# Patient Record
Sex: Male | Born: 1950
Health system: Southern US, Community
[De-identification: ages and names within clinical notes are randomized; demographics above are authoritative.]

## PROBLEM LIST (undated history)

## (undated) DIAGNOSIS — Z86010 Personal history of colonic polyps: Secondary | ICD-10-CM

## (undated) DIAGNOSIS — E782 Mixed hyperlipidemia: Secondary | ICD-10-CM

## (undated) DIAGNOSIS — F411 Generalized anxiety disorder: Secondary | ICD-10-CM

## (undated) DIAGNOSIS — I213 ST elevation (STEMI) myocardial infarction of unspecified site: Secondary | ICD-10-CM

## (undated) DIAGNOSIS — R7309 Other abnormal glucose: Secondary | ICD-10-CM

## (undated) DIAGNOSIS — E119 Type 2 diabetes mellitus without complications: Secondary | ICD-10-CM

## (undated) DIAGNOSIS — F17200 Nicotine dependence, unspecified, uncomplicated: Secondary | ICD-10-CM

## (undated) HISTORY — DX: Generalized anxiety disorder: F41.1

## (undated) HISTORY — DX: Type 2 diabetes mellitus without complications: E11.9

## (undated) HISTORY — PX: WISDOM TOOTH EXTRACTION: SHX21

## (undated) HISTORY — DX: Personal history of colonic polyps: Z86.010

## (undated) HISTORY — DX: Nicotine dependence, unspecified, uncomplicated: F17.200

## (undated) HISTORY — DX: Other abnormal glucose: R73.09

## (undated) HISTORY — DX: Mixed hyperlipidemia: E78.2

## (undated) HISTORY — PX: OTHER SURGICAL HISTORY: SHX169

## (undated) HISTORY — PX: COLONOSCOPY: SHX174

---

## 1998-12-02 ENCOUNTER — Encounter: Payer: Self-pay | Admitting: Internal Medicine

## 1998-12-02 ENCOUNTER — Ambulatory Visit (HOSPITAL_COMMUNITY): Admission: RE | Admit: 1998-12-02 | Discharge: 1998-12-02 | Payer: Self-pay | Admitting: Internal Medicine

## 1999-06-14 ENCOUNTER — Emergency Department (HOSPITAL_COMMUNITY): Admission: EM | Admit: 1999-06-14 | Discharge: 1999-06-14 | Payer: Self-pay

## 2004-03-12 ENCOUNTER — Encounter: Payer: Self-pay | Admitting: Gastroenterology

## 2004-03-12 LAB — HM COLONOSCOPY: HM Colonoscopy: ABNORMAL

## 2004-12-17 ENCOUNTER — Ambulatory Visit: Payer: Self-pay | Admitting: Internal Medicine

## 2006-04-03 ENCOUNTER — Ambulatory Visit: Payer: Self-pay | Admitting: Family Medicine

## 2006-04-13 ENCOUNTER — Ambulatory Visit: Payer: Self-pay | Admitting: Internal Medicine

## 2006-04-16 ENCOUNTER — Ambulatory Visit: Payer: Self-pay

## 2006-07-19 ENCOUNTER — Ambulatory Visit: Payer: Self-pay | Admitting: Internal Medicine

## 2006-07-20 ENCOUNTER — Ambulatory Visit: Payer: Self-pay | Admitting: Licensed Clinical Social Worker

## 2006-07-28 ENCOUNTER — Ambulatory Visit: Payer: Self-pay | Admitting: Licensed Clinical Social Worker

## 2006-08-04 ENCOUNTER — Ambulatory Visit: Payer: Self-pay | Admitting: Licensed Clinical Social Worker

## 2006-08-06 ENCOUNTER — Ambulatory Visit: Payer: Self-pay | Admitting: Licensed Clinical Social Worker

## 2006-08-19 ENCOUNTER — Ambulatory Visit: Payer: Self-pay | Admitting: Licensed Clinical Social Worker

## 2007-04-26 ENCOUNTER — Ambulatory Visit: Payer: Self-pay | Admitting: Internal Medicine

## 2007-04-26 LAB — CONVERTED CEMR LAB
ALT: 24 units/L (ref 0–53)
AST: 16 units/L (ref 0–37)
Albumin: 3.8 g/dL (ref 3.5–5.2)
Alkaline Phosphatase: 67 units/L (ref 39–117)
BUN: 12 mg/dL (ref 6–23)
Basophils Absolute: 0.1 10*3/uL (ref 0.0–0.1)
Basophils Relative: 0.9 % (ref 0.0–1.0)
Bilirubin Urine: NEGATIVE
Bilirubin, Direct: 0.1 mg/dL (ref 0.0–0.3)
CO2: 30 meq/L (ref 19–32)
Calcium: 9.5 mg/dL (ref 8.4–10.5)
Chloride: 107 meq/L (ref 96–112)
Cholesterol: 209 mg/dL (ref 0–200)
Creatinine, Ser: 1.2 mg/dL (ref 0.4–1.5)
Direct LDL: 151 mg/dL
Eosinophils Absolute: 0.2 10*3/uL (ref 0.0–0.6)
Eosinophils Relative: 1.7 % (ref 0.0–5.0)
GFR calc Af Amer: 81 mL/min
GFR calc non Af Amer: 67 mL/min
Glucose, Bld: 149 mg/dL — ABNORMAL HIGH (ref 70–99)
HCT: 46.4 % (ref 39.0–52.0)
HDL: 29.1 mg/dL — ABNORMAL LOW (ref >39.0)
Hemoglobin: 15.7 g/dL (ref 13.0–17.0)
Ketones, ur: NEGATIVE mg/dL
Leukocytes, UA: NEGATIVE
Lymphocytes Relative: 25.4 % (ref 12.0–46.0)
MCHC: 33.9 g/dL (ref 30.0–36.0)
MCV: 89 fL (ref 78.0–100.0)
Monocytes Absolute: 0.7 10*3/uL (ref 0.2–0.7)
Monocytes Relative: 7.4 % (ref 3.0–11.0)
Neutro Abs: 6 10*3/uL (ref 1.4–7.7)
Neutrophils Relative %: 64.6 % (ref 43.0–77.0)
Nitrite: NEGATIVE
PSA: 0.47 ng/mL (ref 0.10–4.00)
Platelets: 216 10*3/uL (ref 150–400)
Potassium: 4.1 meq/L (ref 3.5–5.1)
RBC: 5.22 M/uL (ref 4.22–5.81)
RDW: 12.4 % (ref 11.5–14.6)
Sodium: 143 meq/L (ref 135–145)
Specific Gravity, Urine: 1.02 (ref 1.000–1.03)
TSH: 1.1 microintl units/mL (ref 0.35–5.50)
Total Bilirubin: 0.6 mg/dL (ref 0.3–1.2)
Total CHOL/HDL Ratio: 7.2
Total Protein, Urine: NEGATIVE mg/dL
Total Protein: 6.5 g/dL (ref 6.0–8.3)
Triglycerides: 156 mg/dL — ABNORMAL HIGH (ref 0–149)
Urine Glucose: NEGATIVE mg/dL
Urobilinogen, UA: 0.2 (ref 0.0–1.0)
VLDL: 31 mg/dL (ref 0–40)
WBC: 9.4 10*3/uL (ref 4.5–10.5)
pH: 6 (ref 5.0–8.0)

## 2007-05-03 ENCOUNTER — Encounter: Payer: Self-pay | Admitting: Internal Medicine

## 2007-05-03 ENCOUNTER — Ambulatory Visit: Payer: Self-pay | Admitting: Internal Medicine

## 2007-05-03 DIAGNOSIS — F17200 Nicotine dependence, unspecified, uncomplicated: Secondary | ICD-10-CM

## 2007-05-03 DIAGNOSIS — E782 Mixed hyperlipidemia: Secondary | ICD-10-CM

## 2007-05-03 DIAGNOSIS — F411 Generalized anxiety disorder: Secondary | ICD-10-CM | POA: Insufficient documentation

## 2007-05-03 DIAGNOSIS — R7309 Other abnormal glucose: Secondary | ICD-10-CM

## 2007-05-03 HISTORY — DX: Other abnormal glucose: R73.09

## 2007-05-03 HISTORY — DX: Nicotine dependence, unspecified, uncomplicated: F17.200

## 2007-05-03 HISTORY — DX: Mixed hyperlipidemia: E78.2

## 2007-05-03 HISTORY — DX: Generalized anxiety disorder: F41.1

## 2007-12-04 ENCOUNTER — Encounter: Payer: Self-pay | Admitting: Internal Medicine

## 2007-12-08 ENCOUNTER — Ambulatory Visit: Payer: Self-pay | Admitting: Internal Medicine

## 2007-12-09 ENCOUNTER — Ambulatory Visit: Payer: Self-pay | Admitting: Internal Medicine

## 2008-04-19 ENCOUNTER — Encounter: Payer: Self-pay | Admitting: Internal Medicine

## 2009-06-25 ENCOUNTER — Encounter (INDEPENDENT_AMBULATORY_CARE_PROVIDER_SITE_OTHER): Payer: Self-pay | Admitting: *Deleted

## 2009-07-03 ENCOUNTER — Ambulatory Visit: Payer: Self-pay | Admitting: Internal Medicine

## 2009-07-03 LAB — CONVERTED CEMR LAB
ALT: 29 units/L (ref 0–53)
AST: 17 units/L (ref 0–37)
Albumin: 4 g/dL (ref 3.5–5.2)
Alkaline Phosphatase: 67 units/L (ref 39–117)
BUN: 12 mg/dL (ref 6–23)
Basophils Absolute: 0.1 10*3/uL (ref 0.0–0.1)
Basophils Relative: 0.7 % (ref 0.0–3.0)
Bilirubin Urine: NEGATIVE
Bilirubin, Direct: 0 mg/dL (ref 0.0–0.3)
CO2: 30 meq/L (ref 19–32)
Calcium: 9.4 mg/dL (ref 8.4–10.5)
Chloride: 104 meq/L (ref 96–112)
Cholesterol: 202 mg/dL — ABNORMAL HIGH (ref 0–200)
Creatinine, Ser: 1.2 mg/dL (ref 0.4–1.5)
Direct LDL: 150.5 mg/dL
Eosinophils Absolute: 0.1 10*3/uL (ref 0.0–0.7)
Eosinophils Relative: 1.8 % (ref 0.0–5.0)
GFR calc non Af Amer: 66.41 mL/min (ref >60)
Glucose, Bld: 142 mg/dL — ABNORMAL HIGH (ref 70–99)
HCT: 50.1 % (ref 39.0–52.0)
HDL: 30.3 mg/dL — ABNORMAL LOW (ref >39.00)
Hemoglobin, Urine: NEGATIVE
Hemoglobin: 16.4 g/dL (ref 13.0–17.0)
Ketones, ur: NEGATIVE mg/dL
Leukocytes, UA: NEGATIVE
Lymphocytes Relative: 23.7 % (ref 12.0–46.0)
Lymphs Abs: 1.9 10*3/uL (ref 0.7–4.0)
MCHC: 32.7 g/dL (ref 30.0–36.0)
MCV: 93.6 fL (ref 78.0–100.0)
Monocytes Absolute: 0.5 10*3/uL (ref 0.1–1.0)
Monocytes Relative: 6.8 % (ref 3.0–12.0)
Neutro Abs: 5.3 10*3/uL (ref 1.4–7.7)
Neutrophils Relative %: 67 % (ref 43.0–77.0)
Nitrite: NEGATIVE
PSA: 0.64 ng/mL (ref 0.10–4.00)
Platelets: 175 10*3/uL (ref 150.0–400.0)
Potassium: 4.2 meq/L (ref 3.5–5.1)
RBC: 5.36 M/uL (ref 4.22–5.81)
RDW: 12.2 % (ref 11.5–14.6)
Sodium: 140 meq/L (ref 135–145)
Specific Gravity, Urine: 1.02 (ref 1.000–1.030)
TSH: 0.67 microintl units/mL (ref 0.35–5.50)
Total Bilirubin: 0.7 mg/dL (ref 0.3–1.2)
Total CHOL/HDL Ratio: 7
Total Protein, Urine: NEGATIVE mg/dL
Total Protein: 6.7 g/dL (ref 6.0–8.3)
Triglycerides: 141 mg/dL (ref 0.0–149.0)
Urine Glucose: NEGATIVE mg/dL
Urobilinogen, UA: 0.2 (ref 0.0–1.0)
VLDL: 28.2 mg/dL (ref 0.0–40.0)
WBC: 7.9 10*3/uL (ref 4.5–10.5)
pH: 6 (ref 5.0–8.0)

## 2009-07-09 ENCOUNTER — Ambulatory Visit: Payer: Self-pay | Admitting: Internal Medicine

## 2009-07-09 DIAGNOSIS — Z860100 Personal history of colon polyps, unspecified: Secondary | ICD-10-CM | POA: Insufficient documentation

## 2009-07-09 HISTORY — DX: Personal history of colonic polyps: Z86.010

## 2009-07-09 HISTORY — DX: Personal history of colon polyps, unspecified: Z86.0100

## 2009-07-09 LAB — CONVERTED CEMR LAB: Hgb A1c MFr Bld: 7.6 % — ABNORMAL HIGH (ref 4.6–6.5)

## 2009-08-28 ENCOUNTER — Encounter (INDEPENDENT_AMBULATORY_CARE_PROVIDER_SITE_OTHER): Payer: Self-pay

## 2009-08-30 ENCOUNTER — Ambulatory Visit: Payer: Self-pay | Admitting: Gastroenterology

## 2010-02-13 ENCOUNTER — Telehealth: Payer: Self-pay | Admitting: Internal Medicine

## 2010-02-15 ENCOUNTER — Encounter: Payer: Self-pay | Admitting: Internal Medicine

## 2010-02-20 ENCOUNTER — Telehealth: Payer: Self-pay | Admitting: Gastroenterology

## 2010-05-26 ENCOUNTER — Ambulatory Visit: Payer: Self-pay | Admitting: Internal Medicine

## 2010-09-10 ENCOUNTER — Encounter: Payer: Self-pay | Admitting: Internal Medicine

## 2010-09-11 NOTE — Letter (Signed)
[facility]   Imported By: Sherian Rein 02/21/2010 12:14:36  _____________________________________________________________________  External Attachment:    Type:   Image     Comment:   External Document

## 2010-09-11 NOTE — Progress Notes (Signed)
Summary: Schedule Colonoscopy   Phone Note Outgoing Call Call back at Home Phone [phone]   Call placed by: Harlow Mares CMA Duncan Dull),  February 20, 2010 4:33 PM Call placed to: Patient Summary of Call: Left message on patients machine to call back, patient is due for a colonoscopy, he did have it schedule this past Jan but cancelled and did not resch.  Initial call taken by: Harlow Mares CMA Duncan Dull),  February 20, 2010 4:33 PM  Follow-up for Phone Call        Left a message on the patient machine to call back and schedule a previsit and procedure with our office. A letter will be mailed to the patient.   Follow-up by: Harlow Mares CMA Duncan Dull),  February 28, 2010 4:02 PM

## 2010-09-11 NOTE — Progress Notes (Signed)
Summary: NEW MEDS   Phone Note Call from Patient Call back at [phone]   Summary of Call: Spoke w/patient, he was looking over the last office visit notes. He never recieved an rx for simvastatin or metformin and never called the office to inquire further. Pt aware MD is out of the office and we would get back to him next week. After all this time, does pt need additional labs? f/u office visit? Please advise.  Initial call taken by: Lamar Sprinkles, CMA,  February 13, 2010 4:16 PM  Follow-up for Phone Call        It appears he did receive a copy of the office note which laid out the plan in a clear fashion. He does need simvastatin 40mg  qPM - he was on 20mg ; he does need metformin 500mg  two times a day - take qAM only for 1 week, then two times a day. He will need lab 4 weeks after increase in simvastatin. He will need A1C in 3 months.  If he is unclear about any of this an OV will be necessary Follow-up by: Jacques Navy MD,  February 15, 2010 4:16 PM  Additional Follow-up for Phone Call Additional follow up Details #1::        Pt states he was not on 20mg  of simvastatin. He takes no medication at this time. So office visit? or start at 20mg ?  Additional Follow-up by: Lamar Sprinkles, CMA,  February 17, 2010 1:41 PM    Additional Follow-up for Phone Call Additional follow up Details #2::    start at 40mg -diabetic, needs to have LDL less than 100. Follow-up by: Jacques Navy MD,  February 17, 2010 6:04 PM  Additional Follow-up for Phone Call Additional follow up Details #3:: Details for Additional Follow-up Action Taken: Pt informed Additional Follow-up by: Lamar Sprinkles, CMA,  February 18, 2010 12:40 PM  Prescriptions: METFORMIN HCL 500 MG TABS (METFORMIN HCL) 1 by mouth two times a day  #180 x 1   Entered by:   Lamar Sprinkles, CMA   Authorized by:   Jacques Navy MD   Signed by:   Lamar Sprinkles, CMA on 02/18/2010   Method used:   [facility] to        [phone][facility] (retail)  [address]       [place], [place]  [address]       Ph: [phone] or [phone]       Fax: [phone]   RxID:   [id number] SIMVASTATIN 40 MG TABS (SIMVASTATIN) 1 by mouth qPM  #90 x 1   Entered by:   Lamar Sprinkles, CMA   Authorized by:   Jacques Navy MD   Signed by:   Lamar Sprinkles, CMA on 02/18/2010   Method used:   Electronically to        [facility] [phone]* (retail)       [address]       [place], [place]  [address]       Ph: [phone] or [phone]       Fax: [phone]   RxID:   [id number]

## 2010-09-11 NOTE — Letter (Signed)
Summary: [facility] Instructions  [facility]  [address] [place], [place] [address]   Phone: [phone]  Fax: [phone]       Adam Farmer    [birth year]    MRN: [id number]        Procedure Day Dorna Bloom:  Farrell Ours  09/13/09     Arrival Time:  8:30AM      Procedure Time:  9:30AM     Location of Procedure:                    [facility]iann Pares _  [facility] (4th Floor)                        PREPARATION FOR COLONOSCOPY WITH MOVIPREP   Starting 58 days prior to your procedure 09/08/09 do not eat nuts, seeds, popcorn, corn, beans, peas,  salads, or any raw vegetables.  Do not take any fiber supplements (e.g. Metamucil, Citrucel, and Benefiber).  THE DAY BEFORE YOUR PROCEDURE         DATE:  02/03  DAY: [name].  1.  Drink clear liquids the entire day-NO SOLID FOOD  2.  Do not drink anything colored red or purple.  Avoid juices with pulp.  No orange juice.  3.  Drink at least 64 oz. (8 glasses) of fluid/clear liquids during the day to prevent dehydration and help the prep work efficiently.  CLEAR LIQUIDS INCLUDE: Water Jello Ice Popsicles Tea (sugar ok, no milk/cream) Powdered fruit flavored drinks Coffee (sugar ok, no milk/cream) Gatorade Juice: apple, white grape, white cranberry  Lemonade Clear bullion, consomm, broth Carbonated beverages (any kind) Strained chicken noodle soup Hard Candy                             4.  In the morning, mix first dose of MoviPrep solution:    Empty 1 Pouch A and 1 Pouch B into the disposable container    Add lukewarm drinking water to the top line of the container. Mix to dissolve    Refrigerate (mixed solution should be used within 24 hrs)  5.  Begin drinking the prep at 5:00 p.m. The MoviPrep container is divided by 4 marks.   Every 15 minutes drink the solution down to the next mark (approximately 8 oz) until the full liter is complete.   6.  Follow completed prep with 16 oz of clear liquid of your choice (Nothing  red or purple).  Continue to drink clear liquids until bedtime.  7.  Before going to bed, mix second dose of MoviPrep solution:    Empty 1 Pouch A and 1 Pouch B into the disposable container    Add lukewarm drinking water to the top line of the container. Mix to dissolve    Refrigerate  THE DAY OF YOUR PROCEDURE      DATE:  02/04  DAY: [name].  Beginning at  4:30 a.m. (5 hours before procedure):         1. Every 15 minutes, drink the solution down to the next mark (approx 8 oz) until the full liter is complete.  2. Follow completed prep with 16 oz. of clear liquid of your choice.    3. You may drink clear liquids until  7:30am  (2 HOURS BEFORE PROCEDURE).   MEDICATION INSTRUCTIONS  Unless otherwise instructed, you should take regular prescription medications with a small sip of water  as early as possible the morning of your procedure.          OTHER INSTRUCTIONS  You will need a responsible adult at least 58 years of age to accompany you and drive you home.   This person must remain in the waiting room during your procedure.  Wear loose fitting clothing that is easily removed.  Leave jewelry and other valuables at home.  However, you may wish to bring a book to read or  an iPod/MP3 player to listen to music as you wait for your procedure to start.  Remove all body piercing jewelry and leave at home.  Total time from sign-in until discharge is approximately 2-3 hours.  You should go home directly after your procedure and rest.  You can resume normal activities the  day after your procedure.  The day of your procedure you should not:   Drive   Make legal decisions   Operate machinery   Drink alcohol   Return to work  You will receive specific instructions about eating, activities and medications before you leave.    The above instructions have been reviewed and explained to me by   Ulis Rias RN  August 30, 2009 4:43 PM     I fully understand and  can verbalize these instructions _____________________________ Date _________

## 2010-09-11 NOTE — Miscellaneous (Signed)
Summary: [facility] previsit  Clinical Lists Changes  Medications: Added new medication of MOVIPREP 100 GM  SOLR (PEG-KCL-NACL-NASULF-NA ASC-C) As per prep instructions. - Signed Rx of MOVIPREP 100 GM  SOLR (PEG-KCL-NACL-NASULF-NA ASC-C) As per prep instructions.;  #1 x 0;  Signed;  Entered by: Ulis Rias RN;  Authorized by: Louis Meckel MD;  Method used: [facility] [phone]*, [address], [place], [place]  [address], Ph: [phone] or [phone], Fax: [phone] Allergies: Added new allergy or adverse reaction of * LATEX Observations: Added new observation of NKA: F (08/30/2009 16:15)    Prescriptions: MOVIPREP 100 GM  SOLR (PEG-KCL-NACL-NASULF-NA ASC-C) As per prep instructions.  #1 x 0   Entered by:   Ulis Rias RN   Authorized by:   Louis Meckel MD   Signed by:   Ulis Rias RN on 08/30/2009   Method used:   Electronically to        [facility] [phone]* (retail)       [address]       [place], [place]  [address]       Ph: [phone] or [phone]       Fax: [phone]   RxID:   [id number]

## 2010-09-12 ENCOUNTER — Encounter: Payer: Self-pay | Admitting: Internal Medicine

## 2010-09-25 NOTE — Letter (Signed)
Summary: [facility]   Imported By: Sherian Rein 09/19/2010 11:39:40  _____________________________________________________________________  External Attachment:    Type:   Image     Comment:   External Document

## 2010-12-26 NOTE — Assessment & Plan Note (Signed)
[facility]                             PRIMARY CARE OFFICE NOTE   NAME:Adam Farmer, Adam Farmer                      MRN:          [id number]  DATE:04/13/2006                            DOB:          1951-06-29    HISTORY OF PRESENT ILLNESS:  The patient is a 60 year old Caucasian  gentleman who was seen on August the 25th by Dr. Milinda Antis in Saturday clinic  and diagnosed at that time with acute bronchitis.  He was treated with a Z-  Pak and Mucinex-DM.  The patient reports that he has seen some improvement.  He is still coughing and bringing up scant amount of mucus.  There has been  no fever and no shortness of breath.   The patient is interested in smoking cessation.  Dr. Milinda Antis provided a  prescription for Chantix which the patient has started already.  His quit  date is for this coming Saturday, the 8th.   The patient is reporting that he is having problems with a squeezing type  left-sided chest pain as well as a squeezing discomfort in his neck.  This  seems to be stress related but not exertionally related.   CARDIAC RISKS:  This is a male who has a history of tobacco use.  He has an  elevated serum cholesterol at 230 with an LDL of 159.4 and HDL of 33.8 in  July 2005 with no subsequent lipid panel.   CURRENT MEDICATIONS:  Paxil CR 12.5 mg daily.   LIMITED EXAM:  VITAL SIGNS:  Temperature was 97.3.  Blood pressure 104/67.  Pulse 81.  Weight 187.  GENERAL APPEARANCE:  A well-nourished, well-developed, Caucasian male in no  acute distress.  CHEST:  The patient is moving air well.  There are no rales, wheezes or  rhonchi.  No CVA tenderness.  CARDIOVASCULAR:  2+ radial pulse.  He had no JVD.  No carotid bruits.  He  had a quiet precordium with a regular rate and rhythm.  I appreciated no  murmurs.  NECK:  Palpated and there was a normal sized thyroid.  There was no  lymphadenopathy.  No point tenderness to examination.   DATA BASE:  The patient  had a 12 lead electrocardiogram which revealed a  normal sinus rhythm with no strain pattern and no evidence of old injury.  This could be a normal EKG.   ASSESSMENT AND PLAN:  1. Bronchitis.  The patient states he is doing well with the resolution of      most of his symptoms.  He has some residual cough for which he can      continue to take the Mucinex and over-the-counter cough preparation.  2. Cardiovascular.  The patient with atypical type chest discomfort but      worrisome because of his risk factors.  Plan -  The patient will be set up for a nuclear heart study to rule out obstructive  coronary disease.  1. Hyperlipidemia.  The patient did have a lipid panel in 2005 which      showed an LDL of 159.4 with the ideal  level being 130 or less.  I would      recommend the patient have a followup with the panel and then make      recommendations based on the laboratory data.  Given his concern and my      concern for heart disease, we actually would definitely want his LDL      less than 130 and closer to 100.  2. Smoking cessation.  The patient has started Chantix.  I have encouraged      him in his quit date.  I have asked him to call 1-800-QUIT-NOW for      additional smoking cessation support.   The patient was carefully instructed that if he has a change in his chest  pain or chest discomfort, if he has diaphoresis or shortness of breath, if  he has pain with exertion, if he has pain that wakes him from sleep, he is  to call for an urgent evaluation.  If it is after hours or the weekend, he  should report to the [facility] Emergency Department and state that he is a  [facility] patient.                                   Rosalyn Gess Norins, MD   MEN/MedQ  DD:  04/13/2006  DT:  04/14/2006  Job #:  [id number]

## 2011-08-14 ENCOUNTER — Encounter: Payer: Self-pay | Admitting: Internal Medicine

## 2011-09-03 ENCOUNTER — Encounter: Payer: Self-pay | Admitting: Internal Medicine

## 2011-09-03 ENCOUNTER — Ambulatory Visit (INDEPENDENT_AMBULATORY_CARE_PROVIDER_SITE_OTHER): Payer: BC Managed Care – PPO | Admitting: Internal Medicine

## 2011-09-03 DIAGNOSIS — Z23 Encounter for immunization: Secondary | ICD-10-CM

## 2011-09-03 DIAGNOSIS — Z125 Encounter for screening for malignant neoplasm of prostate: Secondary | ICD-10-CM

## 2011-09-03 DIAGNOSIS — R7309 Other abnormal glucose: Secondary | ICD-10-CM

## 2011-09-03 DIAGNOSIS — E782 Mixed hyperlipidemia: Secondary | ICD-10-CM

## 2011-09-03 DIAGNOSIS — Z0000 Encounter for general adult medical examination without abnormal findings: Secondary | ICD-10-CM

## 2011-09-03 DIAGNOSIS — F17200 Nicotine dependence, unspecified, uncomplicated: Secondary | ICD-10-CM

## 2011-09-03 NOTE — Progress Notes (Signed)
Subjective:    Patient ID: Adam Farmer, male    DOB: 06/15/1951, 61 y.o.   MRN: [id number]  HPI Adam Farmer presents for general exam. In the interval since his last visit no serious illness, no injury no surgery. He did have a flu-like illness w/o fever. He is still recovering.  Past Medical History  Diagnosis Date  . ABNORMAL GLUCOSE NEC 05/03/2007  . ANXIETY 05/03/2007  . COLONIC POLYPS, HX OF 07/09/2009  . HYPERLIPIDEMIA, MIXED 05/03/2007  . TOBACCO USE 05/03/2007   Past Surgical History  Procedure Date  . Fractured elbow, nose and toe    Family History  Problem Relation Age of Onset  . Hyperlipidemia Mother   . Colon polyps Father   . Prostate cancer Father   . Lung cancer Father   . Diabetes Father   . Diabetes Paternal Grandmother   . Cancer Neg Hx   . Heart disease Neg Hx    History   Social History  . Marital Status: Married    Spouse Name: N/A    Number of Children: 2  . Years of Education: 12   Occupational History  . trucking Financial planner    Social History Main Topics  . Smoking status: Current Everyday Smoker -- 1.0 packs/day for 26 years    Types: Cigarettes  . Smokeless tobacco: Not on file   Comment: discussed strategy  . Alcohol Use: 0.5 - 1.0 oz/week    1-2 drink(s) per week  . Drug Use: No  . Sexually Active: Yes -- Male partner(s)   Other Topics Concern  . Not on file   Social History Narrative   Married- '76 (dated since she was 68). Son-'86, graduated [employer]  In [place]; daughter-'81 -  MSW, hospital Child psychotherapist. Wife-fair health: HTN, torn retina. 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       Review of Systems Constitutional:  Negative for fever, chills, activity change and unexpected weight change.  HEENT:  Positive for hearing loss -worse left. Negative ear pain, congestion, neck stiffness and postnasal drip. Negative for sore throat or swallowing problems. Negative for dental  complaints.   Eyes: Negative for vision loss or change in visual acuity.  Respiratory: Negative for chest tightness and wheezing. Negative for DOE.   Cardiovascular: Negative for chest pain or palpitations. No decreased exercise tolerance Gastrointestinal: No change in bowel habit. No bloating or gas. No reflux or indigestion Genitourinary: Negative for urgency, frequency, flank pain and difficulty urinating.  Musculoskeletal: Negative for myalgias, back pain, arthralgias and gait problem.  Neurological: Negative for dizziness, tremors, weakness and headaches.  Hematological: Negative for adenopathy.  Psychiatric/Behavioral: Negative for behavioral problems and dysphoric mood.       Objective:   Physical Exam Filed Vitals:   09/03/11 1444  BP: 116/80  Pulse: 66  Temp: 98 F (36.7 C)  Resp: 14   Gen'l: Well nourished well developed white male in no acute distress  HEENT: Head: Normocephalic and atraumatic. Right Ear: External ear normal. EAC/TM nl. Left Ear: External ear normal.  EAC/TM nl. Nose: Nose normal. Mouth/Throat: Oropharynx is clear and moist. Dentition - native, in good repair. No buccal or palatal lesions. Posterior pharynx clear. Eyes: Conjunctivae and sclera clear. EOM intact. Pupils are equal, round, and reactive to light. Right eye exhibits no discharge. Left eye exhibits no discharge. Neck: Normal range of motion. Neck supple. No JVD present. No tracheal deviation present. No thyromegaly present.  Cardiovascular:  Normal rate, regular rhythm, no gallop, no friction rub, no murmur heard.      Quiet precordium. 2+ radial and DP pulses . No carotid bruits Pulmonary/Chest: Effort normal. No respiratory distress or increased WOB, no wheezes, no rales. No chest wall deformity or CVAT. Abdominal: Soft. Bowel sounds are normal in all quadrants. He exhibits no distension, no tenderness, no rebound or guarding, No heptosplenomegaly  Genitourinary:   Musculoskeletal: Normal range  of motion. He exhibits no edema and no tenderness.       Small and large joints without redness, synovial thickening or deformity. Full range of motion preserved about all small, median and large joints.  Lymphadenopathy:    He has no cervical or supraclavicular adenopathy.  Neurological: He is alert and oriented to person, place, and time. CN II-XII intact. DTRs 2+ and symmetrical biceps, radial and patellar tendons. Cerebellar function normal with no tremor, rigidity, normal gait and station.  Skin: Skin is warm and dry. No rash noted. No erythema.  Psychiatric: He has a normal mood and affect. His behavior is normal. Thought content normal.   Labs pending         Assessment & Plan:

## 2011-09-05 DIAGNOSIS — Z0000 Encounter for general adult medical examination without abnormal findings: Secondary | ICD-10-CM | POA: Insufficient documentation

## 2011-09-05 NOTE — Assessment & Plan Note (Signed)
Interval medical history unremarkable. Physical exam is normal. Labs ordered but pending. He is current with colorectal cancer screening. He is given Tdap today. He is motivated to quit smoking.   In summary - a nice man who appears to be medically stable. Labs are out and recommendations will be made based on results. He will return when ready for the next step in his smoking cessation program.

## 2011-09-05 NOTE — Assessment & Plan Note (Addendum)
Discussed smoking cessation: he is contemplative and has a strong desire to quit. Has quit in the past. Reviewed stratagies, including the use of medication. He had a bad experience with chantix - sleep and dream disturbance. He has used wellbutrin in the past - had sleep disturbance when taking it twice a day.  Plan - he is to keep a smoker's diary - using his information to develop a behavior change plan           He will return when ready to set quit date and he is willing to retry wellbutrin.   (greater than 10 minutes spent on cessation counseling)

## 2011-09-05 NOTE — Assessment & Plan Note (Signed)
Lab Results  Component Value Date   CHOL 202* 07/03/2009   HDL 30.30* 07/03/2009   LDLDIRECT 150.5 07/03/2009   TRIG 141.0 07/03/2009   CHOLHDL 7 07/03/2009   Repeat lab ordered - recommendations to follow. Patient is counseled on the importance of diet and exercise

## 2011-09-05 NOTE — Assessment & Plan Note (Signed)
Lab Results  Component Value Date   HGBA1C 7.6* 07/09/2009   Repeat lab is order with recommendations to follow.

## 2011-12-24 ENCOUNTER — Encounter: Payer: Self-pay | Admitting: Gastroenterology

## 2013-08-02 ENCOUNTER — Emergency Department (INDEPENDENT_AMBULATORY_CARE_PROVIDER_SITE_OTHER)
Admission: EM | Admit: 2013-08-02 | Discharge: 2013-08-02 | Disposition: A | Discharge status: Home / Self Care | Payer: BC Managed Care – PPO | Source: Home / Self Care | Attending: Emergency Medicine | Admitting: Emergency Medicine

## 2013-08-02 ENCOUNTER — Emergency Department (INDEPENDENT_AMBULATORY_CARE_PROVIDER_SITE_OTHER): Payer: BC Managed Care – PPO

## 2013-08-02 ENCOUNTER — Encounter: Payer: Self-pay | Admitting: Emergency Medicine

## 2013-08-02 DIAGNOSIS — J209 Acute bronchitis, unspecified: Secondary | ICD-10-CM

## 2013-08-02 DIAGNOSIS — F17200 Nicotine dependence, unspecified, uncomplicated: Secondary | ICD-10-CM

## 2013-08-02 DIAGNOSIS — R05 Cough: Secondary | ICD-10-CM

## 2013-08-02 DIAGNOSIS — J441 Chronic obstructive pulmonary disease with (acute) exacerbation: Secondary | ICD-10-CM

## 2013-08-02 DIAGNOSIS — R062 Wheezing: Secondary | ICD-10-CM

## 2013-08-02 MED ORDER — PREDNISONE (PAK) 10 MG PO TABS: ORAL_TABLET | ORAL | Status: DC

## 2013-08-02 MED ORDER — CEFTRIAXONE SODIUM 1 G IJ SOLR
1.0000 g | INTRAMUSCULAR | Status: AC
  Administered 2013-08-02: 1 g via INTRAMUSCULAR

## 2013-08-02 MED ORDER — IPRATROPIUM-ALBUTEROL 0.5-2.5 (3) MG/3ML IN SOLN
3.0000 mL | RESPIRATORY_TRACT | Status: AC
  Administered 2013-08-02: 3 mL via RESPIRATORY_TRACT

## 2013-08-02 MED ORDER — ALBUTEROL SULFATE HFA 108 (90 BASE) MCG/ACT IN AERS: INHALATION_SPRAY | RESPIRATORY_TRACT | Status: DC

## 2013-08-02 MED ORDER — LEVOFLOXACIN 500 MG PO TABS: ORAL_TABLET | ORAL | Status: DC

## 2013-08-02 MED ORDER — METHYLPREDNISOLONE ACETATE 80 MG/ML IJ SUSP
80.0000 mg | Freq: Once | INTRAMUSCULAR | Status: AC
  Administered 2013-08-02: 80 mg via INTRAMUSCULAR

## 2013-08-02 NOTE — ED Provider Notes (Signed)
CSN: [id number]     Arrival date & time 08/02/13  1478 History   First MD Initiated Contact with Patient 08/02/13 [phone]     Chief Complaint  Patient presents with  . Cough    Patient is a 62 y.o. male presenting with cough.  Cough  Productive cough x 3 days, started with cold symptoms now coughing up green mucus, wheezing O2 sat 92-95  URI HISTORY  Wessley is a 62 y.o. male who complains of onset of cold symptoms for 3 days. Started with sinus congestion discolored nasal mucus, and progressed into chest symptoms, chest congestion and wheezing. Now has cough productive of yellow-green sputum. Have been using over-the-counter treatment which helps a little bit. He smokes a pack a day. He denies history of asthma or emphysema  No chills/sweats +  Low-grade Fever  +  Nasal congestion +  Discolored Post-nasal drainage No sinus pain/pressure No sore throat  +  cough Positive wheezing Positive chest congestion No hemoptysis Minimal shortness of breath No pleuritic pain  No itchy/red eyes No earache  No nausea No vomiting No abdominal pain No diarrhea  No skin rashes +  Fatigue No myalgias No headache  Past Medical History  Diagnosis Date  . ABNORMAL GLUCOSE NEC 05/03/2007  . ANXIETY 05/03/2007  . COLONIC POLYPS, HX OF 07/09/2009  . HYPERLIPIDEMIA, MIXED 05/03/2007  . TOBACCO USE 05/03/2007   Past Surgical History  Procedure Laterality Date  . Fractured elbow, nose and toe     Family History  Problem Relation Age of Onset  . Hyperlipidemia Mother   . Colon polyps Father   . Prostate cancer Father   . Lung cancer Father   . Diabetes Father   . Diabetes Paternal Grandmother   . Cancer Neg Hx   . Heart disease Neg Hx    History  Substance Use Topics  . Smoking status: Current Every Day Smoker -- 1.00 packs/day for 26 years    Types: Cigarettes  . Smokeless tobacco: Not on file     Comment: discussed strategy  . Alcohol Use: .5 - 1 oz/week    1-2 drink(s)  per week    Review of Systems  Respiratory: Positive for cough.   All other systems reviewed and are negative.    Allergies  Latex  Home Medications   Current Outpatient Rx  Name  Route  Sig  Dispense  Refill  . escitalopram (LEXAPRO) 10 MG tablet   Oral   Take 10 mg by mouth daily.         Marland Kitchen albuterol (PROVENTIL HFA;VENTOLIN HFA) 108 (90 BASE) MCG/ACT inhaler      2 inhalations Q4 hr as needed for wheezing.To pharmacist: Please provide with AeroChamber/spacer, and instruct patient in usage   18 g   0   . levofloxacin (LEVAQUIN) 500 MG tablet      Take 1 tablet daily for 10 days.   10 tablet   0   . metFORMIN (GLUCOPHAGE) 500 MG tablet   Oral   Take 500 mg by mouth 2 (two) times daily with a meal.           . predniSONE (STERAPRED UNI-PAK) 10 MG tablet      Take as directed for 6 days.--Take 6 on day 1, 5 on day 2, 4 on day 3, then 3 tablets on day 4, then 2 tablets on day 5, then 1 on day 6.   21 tablet   0   . simvastatin (ZOCOR)  40 MG tablet   Oral   Take 40 mg by mouth every evening.            BP 125/82  Pulse 77  Temp(Src) 97.6 F (36.4 C) (Oral)  Ht 5\' 11"  (1.803 m)  Wt 173 lb (78.472 kg)  BMI 24.14 kg/m2  SpO2 96% Physical Exam  Nursing note and vitals reviewed. Constitutional: He is oriented to person, place, and time. He appears well-developed and well-nourished. No distress.  Pleasant, cooperative male. No acute distress, but he appears ill, uncomfortable, minimally dyspneic  HENT:  Head: Normocephalic and atraumatic.  Right Ear: Tympanic membrane normal.  Left Ear: Tympanic membrane normal.  Mouth/Throat: Oropharynx is clear and moist. No oropharyngeal exudate.  Mild nasal congestion, mild seromucoid drainage  Eyes: Right eye exhibits no discharge. Left eye exhibits no discharge. No scleral icterus.  Neck: Neck supple.  Cardiovascular: Normal rate, regular rhythm and normal heart sounds.   Pulmonary/Chest: No respiratory distress.  He has decreased breath sounds (mildly decreased throughout). He has wheezes (Diffuse, with prolonged expiratory phase). He has rhonchi (diffuse). He has no rales.  Abdominal: He exhibits no distension.  Musculoskeletal: Normal range of motion.  Lymphadenopathy:    He has no cervical adenopathy.  Neurological: He is alert and oriented to person, place, and time.  Skin: Skin is warm and dry. No rash noted.  Psychiatric: He has a normal mood and affect.    ED Course  Procedures (including critical care time) Labs Review Labs Reviewed - No data to display Imaging Review Dg Chest 2 View  08/02/2013   CLINICAL DATA:  Cough and wheezing for previous 4 days, history of tobacco use.  EXAM: CHEST  2 VIEW  COMPARISON:  PA and lateral chest x-ray of May 22, 2013.  FINDINGS: The lungs are well-expanded. The interstitial markings previously demonstrated to be increased STIR less conspicuous today. There is no alveolar infiltrate nor pleural effusion. The mediastinum is normal in width. The cardiopericardial silhouette is normal in size. The observed portions of the bony structures appear normal.  IMPRESSION: There is borderline hyperinflation but no evidence of pneumonia nor CHF. One cannot exclude acute bronchitis in the appropriate clinical setting.   Electronically Signed   By: David  [place]   On: 08/02/2013 09:11    EKG Interpretation    Date/Time:    Ventricular Rate:    PR Interval:    QRS Duration:   QT Interval:    QTC Calculation:   R Axis:     Text Interpretation:              MDM   1. Acute bronchitis with bronchospasm   2. Acute exacerbation of COPD with asthma    Chest x-ray today shows mild hyperinflation, signs of acute bronchitis. No infiltrate or pneumonia or other acute abnormality. Risks, benefits, alternatives discussed. Depo-Medrol 80 mg IM stat. Rocephin 1 g IM stat. DuoNeb nebulizer treatment.  30 minutes after the DuoNeb treatment, reevaluated and he  felt some better. Lungs improved with mild late expiratory wheezes and a few rhonchi, but this is significant improvement. Good aeration. Breath sounds equal. No rales. Vital signs remained stable. O2 saturation 97% room air  Risks, benefits, alternatives discussed. Prescriptions: Prednisone 10 mg-6 day Dosepak Levaquin 500 mg daily x10 days Albuterol HFA with instructions, use with spacer/AeroChamber  Followup with PCP within one week, sooner if worse or new symptoms. Go to ER stat if any severe or worsening symptoms Advised to quit smoking  Precautions discussed. Red flags discussed. Questions invited and answered. Patient voiced understanding and agreement.  Lajean Manes, MD 08/02/13 [phone]

## 2013-08-02 NOTE — ED Notes (Signed)
Productive cough x 3 days, started with cold symptoms now coughing up green mucus, wheezing O2 sat 92-96

## 2013-11-02 ENCOUNTER — Emergency Department (INDEPENDENT_AMBULATORY_CARE_PROVIDER_SITE_OTHER)
Admission: EM | Admit: 2013-11-02 | Discharge: 2013-11-02 | Disposition: A | Discharge status: Home / Self Care | Payer: BC Managed Care – PPO | Source: Home / Self Care | Attending: Family Medicine | Admitting: Family Medicine

## 2013-11-02 ENCOUNTER — Encounter: Payer: Self-pay | Admitting: Emergency Medicine

## 2013-11-02 DIAGNOSIS — H6090 Unspecified otitis externa, unspecified ear: Secondary | ICD-10-CM

## 2013-11-02 DIAGNOSIS — H60399 Other infective otitis externa, unspecified ear: Secondary | ICD-10-CM

## 2013-11-02 DIAGNOSIS — H6690 Otitis media, unspecified, unspecified ear: Secondary | ICD-10-CM

## 2013-11-02 DIAGNOSIS — Z716 Tobacco abuse counseling: Secondary | ICD-10-CM

## 2013-11-02 MED ORDER — NEOMYCIN-POLYMYXIN-HC 3.5-10000-1 OT SOLN: 3.0000 [drp] | Freq: Four times a day (QID) | OTIC | Status: AC

## 2013-11-02 MED ORDER — AMOXICILLIN 875 MG PO TABS: 875.0000 mg | ORAL_TABLET | Freq: Two times a day (BID) | ORAL | Status: DC

## 2013-11-02 NOTE — ED Provider Notes (Signed)
CSN: [id number]     Arrival date & time 11/02/13  1626 History   First MD Initiated Contact with Patient 11/02/13 1629     Chief Complaint  Patient presents with  . Otalgia    HPI  EAR PAIN Location:  L ear  Description: L ear pain and discomfort  Onset:  2-3 days  Modifying factors: 1 PPD smoker   Symptoms  Sensation of fullness: yes Ear discharge: no URI symptoms: no  Fever: no Tinnitus:no   Dizziness:no   Hearing loss:no   Toothache: no Rashes or lesions: no Facial muscle weakness: no  Red Flags Recent trauma: no PMH prior ear surgery:  no Diabetes or Immunosuppresion: no    Past Medical History  Diagnosis Date  . ABNORMAL GLUCOSE NEC 05/03/2007  . ANXIETY 05/03/2007  . COLONIC POLYPS, HX OF 07/09/2009  . HYPERLIPIDEMIA, MIXED 05/03/2007  . TOBACCO USE 05/03/2007   Past Surgical History  Procedure Laterality Date  . Fractured elbow, nose and toe     Family History  Problem Relation Age of Onset  . Hyperlipidemia Mother   . Colon polyps Father   . Prostate cancer Father   . Lung cancer Father   . Diabetes Father   . Diabetes Paternal Grandmother   . Cancer Neg Hx   . Heart disease Neg Hx    History  Substance Use Topics  . Smoking status: Current Every Day Smoker -- 1.00 packs/day for 26 years    Types: Cigarettes  . Smokeless tobacco: Not on file     Comment: discussed strategy  . Alcohol Use: .5 - 1 oz/week    1-2 drink(s) per week    Review of Systems  All other systems reviewed and are negative.    Allergies  Latex  Home Medications   Current Outpatient Rx  Name  Route  Sig  Dispense  Refill  . albuterol (PROVENTIL HFA;VENTOLIN HFA) 108 (90 BASE) MCG/ACT inhaler      2 inhalations Q4 hr as needed for wheezing.To pharmacist: Please provide with AeroChamber/spacer, and instruct patient in usage   18 g   0   . escitalopram (LEXAPRO) 10 MG tablet   Oral   Take 10 mg by mouth daily.         Marland Kitchen levofloxacin (LEVAQUIN) 500 MG  tablet      Take 1 tablet daily for 10 days.   10 tablet   0   . metFORMIN (GLUCOPHAGE) 500 MG tablet   Oral   Take 500 mg by mouth 2 (two) times daily with a meal.           . predniSONE (STERAPRED UNI-PAK) 10 MG tablet      Take as directed for 6 days.--Take 6 on day 1, 5 on day 2, 4 on day 3, then 3 tablets on day 4, then 2 tablets on day 5, then 1 on day 6.   21 tablet   0   . simvastatin (ZOCOR) 40 MG tablet   Oral   Take 40 mg by mouth every evening.            BP 122/79  Pulse 74  Temp(Src) 97.9 F (36.6 C) (Oral)  Ht 5\' 11"  (1.803 m)  Wt 179 lb (81.194 kg)  BMI 24.98 kg/m2  SpO2 96% Physical Exam  Constitutional: He appears well-developed and well-nourished.  HENT:  Head: Normocephalic and atraumatic.  Right Ear: External ear normal.  L ear mild TM bulging and tenderness to otoscopic  evaluation   Eyes: Conjunctivae are normal. Pupils are equal, round, and reactive to light.  Neck: Normal range of motion. Neck supple.  Cardiovascular: Normal rate, regular rhythm and normal heart sounds.   Pulmonary/Chest: Effort normal.  Abdominal: Soft.  Musculoskeletal: Normal range of motion.  Neurological: He is alert.  Skin: Skin is warm.    ED Course  Procedures (including critical care time) Labs Review Labs Reviewed - No data to display Imaging Review No results found.   MDM   1. AOM (acute otitis media)   2. OE (otitis externa)    Will treat with amox and cortisporin Discussed smoking cessation  Discussed infectious and ENT red flags.  Follow up as needed.     The patient and/or caregiver has been counseled thoroughly with regard to treatment plan and/or medications prescribed including dosage, schedule, interactions, rationale for use, and possible side effects and they verbalize understanding. Diagnoses and expected course of recovery discussed and will return if not improved as expected or if the condition worsens. Patient and/or caregiver  verbalized understanding.         Shanda Howells, MD 11/02/13 [phone]

## 2013-11-02 NOTE — ED Notes (Signed)
Left ear pain x 4 days pain over left eye

## 2013-11-04 ENCOUNTER — Telehealth: Payer: Self-pay

## 2013-11-04 NOTE — ED Notes (Signed)
I called and spoke with patient and he is doing better. I advised to call back if anything changes or if he has questions or concerns.  

## 2014-06-13 ENCOUNTER — Other Ambulatory Visit: Payer: Self-pay | Admitting: Internal Medicine

## 2014-06-13 DIAGNOSIS — F17200 Nicotine dependence, unspecified, uncomplicated: Secondary | ICD-10-CM

## 2014-08-31 ENCOUNTER — Other Ambulatory Visit: Payer: Self-pay

## 2014-09-07 ENCOUNTER — Ambulatory Visit
Admission: RE | Admit: 2014-09-07 | Discharge: 2014-09-07 | Disposition: A | Discharge status: Home / Self Care | Payer: No Typology Code available for payment source | Source: Ambulatory Visit | Attending: Internal Medicine | Admitting: Internal Medicine

## 2014-09-07 DIAGNOSIS — F17200 Nicotine dependence, unspecified, uncomplicated: Secondary | ICD-10-CM

## 2015-11-14 DIAGNOSIS — E7800 Pure hypercholesterolemia, unspecified: Secondary | ICD-10-CM | POA: Diagnosis not present

## 2015-11-14 DIAGNOSIS — E1165 Type 2 diabetes mellitus with hyperglycemia: Secondary | ICD-10-CM | POA: Diagnosis not present

## 2016-03-09 DIAGNOSIS — Z720 Tobacco use: Secondary | ICD-10-CM | POA: Diagnosis not present

## 2016-03-09 DIAGNOSIS — R413 Other amnesia: Secondary | ICD-10-CM | POA: Diagnosis not present

## 2016-03-09 DIAGNOSIS — E1165 Type 2 diabetes mellitus with hyperglycemia: Secondary | ICD-10-CM | POA: Diagnosis not present

## 2016-03-09 DIAGNOSIS — F329 Major depressive disorder, single episode, unspecified: Secondary | ICD-10-CM | POA: Diagnosis not present

## 2016-04-06 DIAGNOSIS — E1165 Type 2 diabetes mellitus with hyperglycemia: Secondary | ICD-10-CM | POA: Diagnosis not present

## 2016-04-06 DIAGNOSIS — E7800 Pure hypercholesterolemia, unspecified: Secondary | ICD-10-CM | POA: Diagnosis not present

## 2016-04-09 DIAGNOSIS — F329 Major depressive disorder, single episode, unspecified: Secondary | ICD-10-CM | POA: Diagnosis not present

## 2016-04-09 DIAGNOSIS — E7800 Pure hypercholesterolemia, unspecified: Secondary | ICD-10-CM | POA: Diagnosis not present

## 2016-04-09 DIAGNOSIS — F17200 Nicotine dependence, unspecified, uncomplicated: Secondary | ICD-10-CM | POA: Diagnosis not present

## 2016-04-09 DIAGNOSIS — E1165 Type 2 diabetes mellitus with hyperglycemia: Secondary | ICD-10-CM | POA: Diagnosis not present

## 2016-06-01 DIAGNOSIS — W19XXXA Unspecified fall, initial encounter: Secondary | ICD-10-CM | POA: Diagnosis not present

## 2016-06-01 DIAGNOSIS — S2232XA Fracture of one rib, left side, initial encounter for closed fracture: Secondary | ICD-10-CM | POA: Diagnosis not present

## 2016-06-01 DIAGNOSIS — M25512 Pain in left shoulder: Secondary | ICD-10-CM | POA: Diagnosis not present

## 2016-06-02 ENCOUNTER — Ambulatory Visit
Admission: RE | Admit: 2016-06-02 | Discharge: 2016-06-02 | Disposition: A | Discharge status: Home / Self Care | Payer: BLUE CROSS/BLUE SHIELD | Source: Ambulatory Visit | Attending: Orthopedic Surgery | Admitting: Orthopedic Surgery

## 2016-06-02 ENCOUNTER — Other Ambulatory Visit: Payer: Self-pay | Admitting: Orthopedic Surgery

## 2016-06-02 DIAGNOSIS — S233XXA Sprain of ligaments of thoracic spine, initial encounter: Secondary | ICD-10-CM | POA: Diagnosis not present

## 2016-06-02 DIAGNOSIS — S2249XD Multiple fractures of ribs, unspecified side, subsequent encounter for fracture with routine healing: Secondary | ICD-10-CM

## 2016-06-02 DIAGNOSIS — S2232XA Fracture of one rib, left side, initial encounter for closed fracture: Secondary | ICD-10-CM | POA: Diagnosis not present

## 2016-06-02 DIAGNOSIS — S2242XA Multiple fractures of ribs, left side, initial encounter for closed fracture: Secondary | ICD-10-CM | POA: Diagnosis not present

## 2016-07-06 DIAGNOSIS — E1165 Type 2 diabetes mellitus with hyperglycemia: Secondary | ICD-10-CM | POA: Diagnosis not present

## 2016-07-06 DIAGNOSIS — E7800 Pure hypercholesterolemia, unspecified: Secondary | ICD-10-CM | POA: Diagnosis not present

## 2016-07-09 DIAGNOSIS — E7800 Pure hypercholesterolemia, unspecified: Secondary | ICD-10-CM | POA: Diagnosis not present

## 2016-07-09 DIAGNOSIS — E1165 Type 2 diabetes mellitus with hyperglycemia: Secondary | ICD-10-CM | POA: Diagnosis not present

## 2016-07-09 DIAGNOSIS — I708 Atherosclerosis of other arteries: Secondary | ICD-10-CM | POA: Diagnosis not present

## 2016-07-09 DIAGNOSIS — S2232XA Fracture of one rib, left side, initial encounter for closed fracture: Secondary | ICD-10-CM | POA: Diagnosis not present

## 2016-10-05 DIAGNOSIS — E1165 Type 2 diabetes mellitus with hyperglycemia: Secondary | ICD-10-CM | POA: Diagnosis not present

## 2016-10-05 DIAGNOSIS — Z125 Encounter for screening for malignant neoplasm of prostate: Secondary | ICD-10-CM | POA: Diagnosis not present

## 2016-10-05 DIAGNOSIS — M810 Age-related osteoporosis without current pathological fracture: Secondary | ICD-10-CM | POA: Diagnosis not present

## 2016-10-05 DIAGNOSIS — Z0000 Encounter for general adult medical examination without abnormal findings: Secondary | ICD-10-CM | POA: Diagnosis not present

## 2016-10-12 ENCOUNTER — Encounter: Payer: Self-pay | Admitting: Gastroenterology

## 2016-10-12 DIAGNOSIS — Z0000 Encounter for general adult medical examination without abnormal findings: Secondary | ICD-10-CM | POA: Diagnosis not present

## 2016-10-12 DIAGNOSIS — R05 Cough: Secondary | ICD-10-CM | POA: Diagnosis not present

## 2016-10-12 DIAGNOSIS — R0609 Other forms of dyspnea: Secondary | ICD-10-CM | POA: Diagnosis not present

## 2016-10-12 DIAGNOSIS — E7800 Pure hypercholesterolemia, unspecified: Secondary | ICD-10-CM | POA: Diagnosis not present

## 2016-10-12 DIAGNOSIS — F988 Other specified behavioral and emotional disorders with onset usually occurring in childhood and adolescence: Secondary | ICD-10-CM | POA: Diagnosis not present

## 2016-10-12 DIAGNOSIS — E118 Type 2 diabetes mellitus with unspecified complications: Secondary | ICD-10-CM | POA: Diagnosis not present

## 2016-10-19 DIAGNOSIS — F17200 Nicotine dependence, unspecified, uncomplicated: Secondary | ICD-10-CM | POA: Diagnosis not present

## 2016-10-19 DIAGNOSIS — Z0189 Encounter for other specified special examinations: Secondary | ICD-10-CM | POA: Diagnosis not present

## 2016-10-19 DIAGNOSIS — E7800 Pure hypercholesterolemia, unspecified: Secondary | ICD-10-CM | POA: Diagnosis not present

## 2016-10-19 DIAGNOSIS — R0602 Shortness of breath: Secondary | ICD-10-CM | POA: Diagnosis not present

## 2016-10-28 DIAGNOSIS — R0602 Shortness of breath: Secondary | ICD-10-CM | POA: Diagnosis not present

## 2016-11-13 DIAGNOSIS — R0600 Dyspnea, unspecified: Secondary | ICD-10-CM | POA: Diagnosis not present

## 2016-11-24 DIAGNOSIS — R0602 Shortness of breath: Secondary | ICD-10-CM | POA: Diagnosis not present

## 2016-11-24 DIAGNOSIS — Z0189 Encounter for other specified special examinations: Secondary | ICD-10-CM | POA: Diagnosis not present

## 2016-11-24 DIAGNOSIS — F17200 Nicotine dependence, unspecified, uncomplicated: Secondary | ICD-10-CM | POA: Diagnosis not present

## 2016-11-24 DIAGNOSIS — E7800 Pure hypercholesterolemia, unspecified: Secondary | ICD-10-CM | POA: Diagnosis not present

## 2016-11-27 ENCOUNTER — Ambulatory Visit (AMBULATORY_SURGERY_CENTER): Payer: Self-pay

## 2016-11-27 VITALS — Ht 71.75 in | Wt 186.4 lb

## 2016-11-27 DIAGNOSIS — Z800 Family history of malignant neoplasm of digestive organs: Secondary | ICD-10-CM

## 2016-11-27 MED ORDER — SUPREP BOWEL PREP KIT 17.5-3.13-1.6 GM/177ML PO SOLN: 1.0000 | Freq: Once | ORAL | 0 refills | Status: AC

## 2016-11-27 NOTE — Progress Notes (Signed)
No diet meds No home oxygen No allergies to eggs or soy No past problems with anesthesia  Registered emmi 

## 2016-12-03 ENCOUNTER — Encounter: Payer: Self-pay | Admitting: Gastroenterology

## 2016-12-10 DIAGNOSIS — E7800 Pure hypercholesterolemia, unspecified: Secondary | ICD-10-CM | POA: Diagnosis not present

## 2016-12-10 DIAGNOSIS — E1165 Type 2 diabetes mellitus with hyperglycemia: Secondary | ICD-10-CM | POA: Diagnosis not present

## 2016-12-10 DIAGNOSIS — M545 Low back pain: Secondary | ICD-10-CM | POA: Diagnosis not present

## 2016-12-10 DIAGNOSIS — F17200 Nicotine dependence, unspecified, uncomplicated: Secondary | ICD-10-CM | POA: Diagnosis not present

## 2016-12-11 ENCOUNTER — Ambulatory Visit (AMBULATORY_SURGERY_CENTER): Payer: BLUE CROSS/BLUE SHIELD | Admitting: Gastroenterology

## 2016-12-11 ENCOUNTER — Encounter: Payer: Self-pay | Admitting: Gastroenterology

## 2016-12-11 VITALS — BP 115/80 | HR 63 | Temp 97.5°F | Resp 17 | Ht 71.75 in | Wt 186.0 lb

## 2016-12-11 DIAGNOSIS — Z1211 Encounter for screening for malignant neoplasm of colon: Secondary | ICD-10-CM | POA: Diagnosis present

## 2016-12-11 DIAGNOSIS — Z1212 Encounter for screening for malignant neoplasm of rectum: Secondary | ICD-10-CM | POA: Diagnosis not present

## 2016-12-11 DIAGNOSIS — D120 Benign neoplasm of cecum: Secondary | ICD-10-CM

## 2016-12-11 DIAGNOSIS — Z800 Family history of malignant neoplasm of digestive organs: Secondary | ICD-10-CM | POA: Diagnosis not present

## 2016-12-11 MED ORDER — SODIUM CHLORIDE 0.9 % IV SOLN: 500.0000 mL | INTRAVENOUS | Status: DC

## 2016-12-11 NOTE — Progress Notes (Signed)
When checking pt in, he states he does not have a family hx of colon CA; chart and previous report states his parent had colon CA, but pt denies this.

## 2016-12-11 NOTE — Progress Notes (Signed)
Report to PACU, RN, vss, BBS= Clear.  

## 2016-12-11 NOTE — Progress Notes (Signed)
Called to room to assist during endoscopic procedure.  Patient ID and intended procedure confirmed with present staff. Received instructions for my participation in the procedure from the performing physician.  

## 2016-12-11 NOTE — Patient Instructions (Signed)
Handout given on polyps  YOU HAD AN ENDOSCOPIC PROCEDURE TODAY: Refer to the procedure report and other information in the discharge instructions given to you for any specific questions about what was found during the examination. If this information does not answer your questions, please call [place] office at [phone] to clarify.   YOU SHOULD EXPECT: Some feelings of bloating in the abdomen. Passage of more gas than usual. Walking can help get rid of the air that was put into your GI tract during the procedure and reduce the bloating. If you had a lower endoscopy (such as a colonoscopy or flexible sigmoidoscopy) you may notice spotting of blood in your stool or on the toilet paper. Some abdominal soreness may be present for a day or two, also.  DIET: Your first meal following the procedure should be a light meal and then it is ok to progress to your normal diet. A half-sandwich or bowl of soup is an example of a good first meal. Heavy or fried foods are harder to digest and may make you feel nauseous or bloated. Drink plenty of fluids but you should avoid alcoholic beverages for 24 hours. If you had a esophageal dilation, please see attached instructions for diet.    ACTIVITY: Your care partner should take you home directly after the procedure. You should plan to take it easy, moving slowly for the rest of the day. You can resume normal activity the day after the procedure however YOU SHOULD NOT DRIVE, use power tools, machinery or perform tasks that involve climbing or major physical exertion for 24 hours (because of the sedation medicines used during the test).   SYMPTOMS TO REPORT IMMEDIATELY: A gastroenterologist can be reached at any hour. Please call [phone]  for any of the following symptoms:  Following lower endoscopy (colonoscopy, flexible sigmoidoscopy) Excessive amounts of blood in the stool  Significant tenderness, worsening of abdominal pains  Swelling of the abdomen that is  new, acute  Fever of 100 or higher    FOLLOW UP:  If any biopsies were taken you will be contacted by phone or by letter within the next 1-3 weeks. Call [phone]  if you have not heard about the biopsies in 3 weeks.  Please also call with any specific questions about appointments or follow up tests.  

## 2016-12-11 NOTE — Op Note (Signed)
[place] Patient Name: Adam Farmer Procedure Date: 12/11/2016 1:26 PM MRN: [id number] Endoscopist: Mallie Mussel L. Loletha Carrow , MD Age: 66 Referring MD:  Date of Birth: 01-31-1951 Gender: Male Account #: [id number] Procedure:                Colonoscopy Indications:              Screening for colorectal malignant neoplasm (no                            polyps on 03/2004 colonoscopy) Medicines:                Monitored Anesthesia Care Procedure:                Pre-Anesthesia Assessment:                           - Prior to the procedure, a History and Physical                            was performed, and patient medications and                            allergies were reviewed. The patient's tolerance of                            previous anesthesia was also reviewed. The risks                            and benefits of the procedure and the sedation                            options and risks were discussed with the patient.                            All questions were answered, and informed consent                            was obtained. Prior Anticoagulants: The patient has                            taken no previous anticoagulant or antiplatelet                            agents. ASA Grade Assessment: II - A patient with                            mild systemic disease. After reviewing the risks                            and benefits, the patient was deemed in                            satisfactory condition to undergo the procedure.  After obtaining informed consent, the colonoscope                            was passed under direct vision. Throughout the                            procedure, the patient's blood pressure, pulse, and                            oxygen saturations were monitored continuously. The                            Colonoscope was introduced through the anus and                            advanced to the the cecum, identified  by                            appendiceal orifice and ileocecal valve. The                            colonoscopy was performed without difficulty. The                            patient tolerated the procedure well. The quality                            of the bowel preparation was excellent. The                            ileocecal valve, appendiceal orifice, and rectum                            were photographed. The quality of the bowel                            preparation was evaluated using the BBPS [facility]                            Bowel Preparation Scale) with scores of: Right                            Colon = 3, Transverse Colon = 3 and Left Colon = 3                            (entire mucosa seen well with no residual staining,                            small fragments of stool or opaque liquid). The                            total BBPS score equals 9. The bowel preparation  used was SUPREP. Scope In: 1:40:03 PM Scope Out: 1:52:39 PM Scope Withdrawal Time: 0 hours 9 minutes 58 seconds  Total Procedure Duration: 0 hours 12 minutes 36 seconds  Findings:                 The perianal and digital rectal examinations were                            normal.                           An 8-10 mm polyp was found in the cecum, opposite                            the ICV. The polyp was sessile. The polyp was                            removed with a cold snare. Resection and retrieval                            were complete.                           The exam was otherwise without abnormality on                            direct and retroflexion views. Complications:            No immediate complications. Estimated Blood Loss:     Estimated blood loss: none. Impression:               - One 8-10 mm polyp in the cecum, removed with a                            cold snare. Resected and retrieved.                           - The examination was otherwise  normal on direct                            and retroflexion views. Recommendation:           - Patient has a contact number available for                            emergencies. The signs and symptoms of potential                            delayed complications were discussed with the                            patient. Return to normal activities tomorrow.                            Written discharge instructions were provided to the  patient.                           - Resume previous diet.                           - Continue present medications.                           - Await pathology results.                           - Repeat colonoscopy is recommended for                            surveillance. The colonoscopy date will be                            determined after pathology results from today's                            exam become available for review. Safire Gordin L. Loletha Carrow, MD 12/11/2016 2:01:40 PM This report has been signed electronically.

## 2016-12-11 NOTE — Progress Notes (Signed)
Pt's states no medical or surgical changes since previsit or office visit. 

## 2016-12-14 ENCOUNTER — Telehealth: Payer: Self-pay

## 2016-12-14 NOTE — Telephone Encounter (Signed)
  Follow up Call-  Call back number 12/11/2016  Post procedure Call Back phone  # [phone]  Permission to leave phone message Yes  Some recent data might be hidden     Patient questions:  Do you have a fever, pain , or abdominal swelling? No. Pain Score  0 *  Have you tolerated food without any problems? Yes.    Have you been able to return to your normal activities? Yes.    Do you have any questions about your discharge instructions: Diet   No. Medications  No. Follow up visit  No.  Do you have questions or concerns about your Care? No.  Actions: * If pain score is 4 or above: No action needed, pain <4.

## 2016-12-15 ENCOUNTER — Encounter: Payer: Self-pay | Admitting: Gastroenterology

## 2016-12-23 DIAGNOSIS — M5431 Sciatica, right side: Secondary | ICD-10-CM | POA: Diagnosis not present

## 2017-02-02 DIAGNOSIS — Z5181 Encounter for therapeutic drug level monitoring: Secondary | ICD-10-CM | POA: Diagnosis not present

## 2017-02-02 DIAGNOSIS — E7800 Pure hypercholesterolemia, unspecified: Secondary | ICD-10-CM | POA: Diagnosis not present

## 2017-02-02 DIAGNOSIS — F988 Other specified behavioral and emotional disorders with onset usually occurring in childhood and adolescence: Secondary | ICD-10-CM | POA: Diagnosis not present

## 2017-02-02 DIAGNOSIS — E1165 Type 2 diabetes mellitus with hyperglycemia: Secondary | ICD-10-CM | POA: Diagnosis not present

## 2017-02-09 DIAGNOSIS — F17200 Nicotine dependence, unspecified, uncomplicated: Secondary | ICD-10-CM | POA: Diagnosis not present

## 2017-02-09 DIAGNOSIS — E7800 Pure hypercholesterolemia, unspecified: Secondary | ICD-10-CM | POA: Diagnosis not present

## 2017-02-09 DIAGNOSIS — E1165 Type 2 diabetes mellitus with hyperglycemia: Secondary | ICD-10-CM | POA: Diagnosis not present

## 2017-03-26 DIAGNOSIS — M7582 Other shoulder lesions, left shoulder: Secondary | ICD-10-CM | POA: Diagnosis not present

## 2017-05-12 DIAGNOSIS — E1165 Type 2 diabetes mellitus with hyperglycemia: Secondary | ICD-10-CM | POA: Diagnosis not present

## 2017-05-19 DIAGNOSIS — E119 Type 2 diabetes mellitus without complications: Secondary | ICD-10-CM | POA: Diagnosis not present

## 2017-05-19 DIAGNOSIS — F988 Other specified behavioral and emotional disorders with onset usually occurring in childhood and adolescence: Secondary | ICD-10-CM | POA: Diagnosis not present

## 2017-08-19 DIAGNOSIS — Z79899 Other long term (current) drug therapy: Secondary | ICD-10-CM | POA: Diagnosis not present

## 2017-08-19 DIAGNOSIS — R413 Other amnesia: Secondary | ICD-10-CM | POA: Diagnosis not present

## 2017-08-19 DIAGNOSIS — E119 Type 2 diabetes mellitus without complications: Secondary | ICD-10-CM | POA: Diagnosis not present

## 2017-08-26 DIAGNOSIS — E119 Type 2 diabetes mellitus without complications: Secondary | ICD-10-CM | POA: Diagnosis not present

## 2017-08-26 DIAGNOSIS — F329 Major depressive disorder, single episode, unspecified: Secondary | ICD-10-CM | POA: Diagnosis not present

## 2017-08-26 DIAGNOSIS — E7800 Pure hypercholesterolemia, unspecified: Secondary | ICD-10-CM | POA: Diagnosis not present

## 2017-11-24 DIAGNOSIS — E7800 Pure hypercholesterolemia, unspecified: Secondary | ICD-10-CM | POA: Diagnosis not present

## 2017-11-24 DIAGNOSIS — E119 Type 2 diabetes mellitus without complications: Secondary | ICD-10-CM | POA: Diagnosis not present

## 2017-12-01 DIAGNOSIS — F329 Major depressive disorder, single episode, unspecified: Secondary | ICD-10-CM | POA: Diagnosis not present

## 2017-12-01 DIAGNOSIS — E1165 Type 2 diabetes mellitus with hyperglycemia: Secondary | ICD-10-CM | POA: Diagnosis not present

## 2017-12-01 DIAGNOSIS — F17200 Nicotine dependence, unspecified, uncomplicated: Secondary | ICD-10-CM | POA: Diagnosis not present

## 2017-12-01 DIAGNOSIS — E7800 Pure hypercholesterolemia, unspecified: Secondary | ICD-10-CM | POA: Diagnosis not present

## 2018-05-11 DIAGNOSIS — D229 Melanocytic nevi, unspecified: Secondary | ICD-10-CM | POA: Diagnosis not present

## 2018-05-11 DIAGNOSIS — D225 Melanocytic nevi of trunk: Secondary | ICD-10-CM | POA: Diagnosis not present

## 2018-05-11 DIAGNOSIS — L821 Other seborrheic keratosis: Secondary | ICD-10-CM | POA: Diagnosis not present

## 2018-05-11 DIAGNOSIS — L814 Other melanin hyperpigmentation: Secondary | ICD-10-CM | POA: Diagnosis not present

## 2018-05-31 DIAGNOSIS — Z0000 Encounter for general adult medical examination without abnormal findings: Secondary | ICD-10-CM | POA: Diagnosis not present

## 2018-05-31 DIAGNOSIS — E1165 Type 2 diabetes mellitus with hyperglycemia: Secondary | ICD-10-CM | POA: Diagnosis not present

## 2018-05-31 DIAGNOSIS — F329 Major depressive disorder, single episode, unspecified: Secondary | ICD-10-CM | POA: Diagnosis not present

## 2018-05-31 DIAGNOSIS — Z79899 Other long term (current) drug therapy: Secondary | ICD-10-CM | POA: Diagnosis not present

## 2018-05-31 DIAGNOSIS — E7800 Pure hypercholesterolemia, unspecified: Secondary | ICD-10-CM | POA: Diagnosis not present

## 2018-06-07 DIAGNOSIS — Z23 Encounter for immunization: Secondary | ICD-10-CM | POA: Diagnosis not present

## 2018-06-07 DIAGNOSIS — Z0000 Encounter for general adult medical examination without abnormal findings: Secondary | ICD-10-CM | POA: Diagnosis not present

## 2018-09-12 DIAGNOSIS — J019 Acute sinusitis, unspecified: Secondary | ICD-10-CM | POA: Diagnosis not present

## 2019-01-16 ENCOUNTER — Inpatient Hospital Stay (HOSPITAL_COMMUNITY)
Admission: EM | Admit: 2019-01-16 | Discharge: 2019-01-19 | DRG: 247 | Disposition: A | Discharge status: Home / Self Care | Payer: BC Managed Care – PPO | Attending: Interventional Cardiology | Admitting: Interventional Cardiology

## 2019-01-16 ENCOUNTER — Encounter (HOSPITAL_COMMUNITY): Payer: Self-pay | Admitting: Emergency Medicine

## 2019-01-16 ENCOUNTER — Encounter (HOSPITAL_COMMUNITY)
Admission: EM | Disposition: A | Discharge status: Home / Self Care | Payer: Self-pay | Source: Home / Self Care | Attending: Interventional Cardiology

## 2019-01-16 ENCOUNTER — Emergency Department (HOSPITAL_COMMUNITY): Payer: BC Managed Care – PPO

## 2019-01-16 ENCOUNTER — Other Ambulatory Visit: Payer: Self-pay

## 2019-01-16 DIAGNOSIS — I2119 ST elevation (STEMI) myocardial infarction involving other coronary artery of inferior wall: Secondary | ICD-10-CM

## 2019-01-16 DIAGNOSIS — I2510 Atherosclerotic heart disease of native coronary artery without angina pectoris: Secondary | ICD-10-CM

## 2019-01-16 DIAGNOSIS — I2102 ST elevation (STEMI) myocardial infarction involving left anterior descending coronary artery: Secondary | ICD-10-CM | POA: Diagnosis not present

## 2019-01-16 DIAGNOSIS — E119 Type 2 diabetes mellitus without complications: Secondary | ICD-10-CM | POA: Diagnosis present

## 2019-01-16 DIAGNOSIS — I2109 ST elevation (STEMI) myocardial infarction involving other coronary artery of anterior wall: Secondary | ICD-10-CM | POA: Diagnosis not present

## 2019-01-16 DIAGNOSIS — F17200 Nicotine dependence, unspecified, uncomplicated: Secondary | ICD-10-CM | POA: Diagnosis present

## 2019-01-16 DIAGNOSIS — Z7984 Long term (current) use of oral hypoglycemic drugs: Secondary | ICD-10-CM

## 2019-01-16 DIAGNOSIS — Z1159 Encounter for screening for other viral diseases: Secondary | ICD-10-CM | POA: Diagnosis not present

## 2019-01-16 DIAGNOSIS — Z20828 Contact with and (suspected) exposure to other viral communicable diseases: Secondary | ICD-10-CM | POA: Diagnosis not present

## 2019-01-16 DIAGNOSIS — F17210 Nicotine dependence, cigarettes, uncomplicated: Secondary | ICD-10-CM | POA: Diagnosis not present

## 2019-01-16 DIAGNOSIS — Z955 Presence of coronary angioplasty implant and graft: Secondary | ICD-10-CM

## 2019-01-16 DIAGNOSIS — I213 ST elevation (STEMI) myocardial infarction of unspecified site: Secondary | ICD-10-CM

## 2019-01-16 DIAGNOSIS — I10 Essential (primary) hypertension: Secondary | ICD-10-CM | POA: Diagnosis not present

## 2019-01-16 DIAGNOSIS — I361 Nonrheumatic tricuspid (valve) insufficiency: Secondary | ICD-10-CM | POA: Diagnosis not present

## 2019-01-16 DIAGNOSIS — R110 Nausea: Secondary | ICD-10-CM | POA: Diagnosis not present

## 2019-01-16 DIAGNOSIS — E782 Mixed hyperlipidemia: Secondary | ICD-10-CM | POA: Diagnosis not present

## 2019-01-16 DIAGNOSIS — Z720 Tobacco use: Secondary | ICD-10-CM | POA: Diagnosis not present

## 2019-01-16 DIAGNOSIS — Z79899 Other long term (current) drug therapy: Secondary | ICD-10-CM

## 2019-01-16 DIAGNOSIS — Z833 Family history of diabetes mellitus: Secondary | ICD-10-CM | POA: Diagnosis not present

## 2019-01-16 DIAGNOSIS — E118 Type 2 diabetes mellitus with unspecified complications: Secondary | ICD-10-CM | POA: Diagnosis not present

## 2019-01-16 DIAGNOSIS — R079 Chest pain, unspecified: Secondary | ICD-10-CM | POA: Diagnosis not present

## 2019-01-16 DIAGNOSIS — E785 Hyperlipidemia, unspecified: Secondary | ICD-10-CM | POA: Diagnosis not present

## 2019-01-16 HISTORY — PX: CORONARY/GRAFT ACUTE MI REVASCULARIZATION: CATH118305

## 2019-01-16 HISTORY — PX: LEFT HEART CATH AND CORONARY ANGIOGRAPHY: CATH118249

## 2019-01-16 HISTORY — DX: ST elevation (STEMI) myocardial infarction of unspecified site: I21.3

## 2019-01-16 HISTORY — PX: CORONARY STENT INTERVENTION: CATH118234

## 2019-01-16 LAB — HEPARIN LEVEL (UNFRACTIONATED): Heparin Unfractionated: <0.1 IU/mL — ABNORMAL LOW (ref 0.30–0.70)

## 2019-01-16 LAB — BASIC METABOLIC PANEL
Anion gap: 14 (ref 5–15)
BUN: 15 mg/dL (ref 8–23)
CO2: 21 mmol/L — ABNORMAL LOW (ref 22–32)
Calcium: 9 mg/dL (ref 8.9–10.3)
Chloride: 99 mmol/L (ref 98–111)
Creatinine, Ser: 1.36 mg/dL — ABNORMAL HIGH (ref 0.61–1.24)
GFR calc Af Amer: >60 mL/min (ref >60)
GFR calc non Af Amer: 54 mL/min — ABNORMAL LOW (ref >60)
Glucose, Bld: 196 mg/dL — ABNORMAL HIGH (ref 70–99)
Potassium: 3.7 mmol/L (ref 3.5–5.1)
Sodium: 134 mmol/L — ABNORMAL LOW (ref 135–145)

## 2019-01-16 LAB — CBC
HCT: 45.7 % (ref 39.0–52.0)
Hemoglobin: 15.4 g/dL (ref 13.0–17.0)
MCH: 30.9 pg (ref 26.0–34.0)
MCHC: 33.7 g/dL (ref 30.0–36.0)
MCV: 91.8 fL (ref 80.0–100.0)
Platelets: 201 10*3/uL (ref 150–400)
RBC: 4.98 MIL/uL (ref 4.22–5.81)
RDW: 13.5 % (ref 11.5–15.5)
WBC: 15.5 10*3/uL — ABNORMAL HIGH (ref 4.0–10.5)
nRBC: 0 % (ref 0.0–0.2)

## 2019-01-16 LAB — TROPONIN I: Troponin I: <0.03 ng/mL (ref <0.03)

## 2019-01-16 LAB — SARS CORONAVIRUS 2 BY RT PCR (HOSPITAL ORDER, PERFORMED IN ~~LOC~~ HOSPITAL LAB): SARS Coronavirus 2: NEGATIVE

## 2019-01-16 SURGERY — CORONARY/GRAFT ACUTE MI REVASCULARIZATION
Anesthesia: LOCAL

## 2019-01-16 MED ORDER — TIROFIBAN HCL IN NACL 5-0.9 MG/100ML-% IV SOLN
INTRAVENOUS | Status: AC
  Filled 2019-01-16: qty 100

## 2019-01-16 MED ORDER — LIDOCAINE HCL (PF) 1 % IJ SOLN
INTRAMUSCULAR | Status: DC | PRN
  Administered 2019-01-16: 2 mL

## 2019-01-16 MED ORDER — IOHEXOL 350 MG/ML SOLN
INTRAVENOUS | Status: DC | PRN
  Administered 2019-01-16: 22:00:00 225 mL via INTRA_ARTERIAL

## 2019-01-16 MED ORDER — MIDAZOLAM HCL 2 MG/2ML IJ SOLN
INTRAMUSCULAR | Status: AC
  Filled 2019-01-16: qty 2

## 2019-01-16 MED ORDER — HEPARIN SODIUM (PORCINE) 1000 UNIT/ML IJ SOLN
INTRAMUSCULAR | Status: AC
  Filled 2019-01-16: qty 1

## 2019-01-16 MED ORDER — SODIUM CHLORIDE 0.9 % IV SOLN: 250.0000 mL | INTRAVENOUS | Status: DC | PRN

## 2019-01-16 MED ORDER — HYDRALAZINE HCL 20 MG/ML IJ SOLN: 10.0000 mg | INTRAMUSCULAR | Status: AC | PRN

## 2019-01-16 MED ORDER — TICAGRELOR 90 MG PO TABS
ORAL_TABLET | ORAL | Status: AC
  Filled 2019-01-16: qty 1

## 2019-01-16 MED ORDER — ATORVASTATIN CALCIUM 80 MG PO TABS: 80.0000 mg | ORAL_TABLET | Freq: Every day | ORAL | Status: DC

## 2019-01-16 MED ORDER — HEPARIN (PORCINE) IN NACL 1000-0.9 UT/500ML-% IV SOLN
INTRAVENOUS | Status: DC | PRN
  Administered 2019-01-16 (×2): 500 mL

## 2019-01-16 MED ORDER — ESCITALOPRAM OXALATE 10 MG PO TABS
10.0000 mg | ORAL_TABLET | Freq: Every day | ORAL | Status: DC
  Administered 2019-01-17 – 2019-01-19 (×3): 10 mg via ORAL
  Filled 2019-01-16 (×3): qty 1

## 2019-01-16 MED ORDER — ASPIRIN 81 MG PO CHEW
324.0000 mg | CHEWABLE_TABLET | Freq: Once | ORAL | Status: AC
  Administered 2019-01-16: 324 mg via ORAL
  Filled 2019-01-16: qty 4

## 2019-01-16 MED ORDER — SODIUM CHLORIDE 0.9% FLUSH
3.0000 mL | Freq: Two times a day (BID) | INTRAVENOUS | Status: DC
  Administered 2019-01-17 – 2019-01-19 (×6): 3 mL via INTRAVENOUS

## 2019-01-16 MED ORDER — NITROGLYCERIN 1 MG/10 ML FOR IR/CATH LAB
INTRA_ARTERIAL | Status: DC | PRN
  Administered 2019-01-16 (×2): 200 ug via INTRACORONARY

## 2019-01-16 MED ORDER — SODIUM CHLORIDE 0.9 % IV SOLN
INTRAVENOUS | Status: AC
  Administered 2019-01-17: 05:00:00 via INTRAVENOUS

## 2019-01-16 MED ORDER — METOPROLOL TARTRATE 12.5 MG HALF TABLET
12.5000 mg | ORAL_TABLET | Freq: Two times a day (BID) | ORAL | Status: DC
  Administered 2019-01-17 – 2019-01-18 (×4): 12.5 mg via ORAL
  Filled 2019-01-16 (×4): qty 1

## 2019-01-16 MED ORDER — FENTANYL CITRATE (PF) 100 MCG/2ML IJ SOLN
INTRAMUSCULAR | Status: AC
  Filled 2019-01-16: qty 2

## 2019-01-16 MED ORDER — SODIUM CHLORIDE 0.9% FLUSH
3.0000 mL | Freq: Once | INTRAVENOUS | Status: AC
  Administered 2019-01-16: 3 mL via INTRAVENOUS

## 2019-01-16 MED ORDER — SODIUM CHLORIDE 0.9% FLUSH: 3.0000 mL | INTRAVENOUS | Status: DC | PRN

## 2019-01-16 MED ORDER — ASPIRIN 81 MG PO CHEW: 81.0000 mg | CHEWABLE_TABLET | Freq: Every day | ORAL | Status: DC

## 2019-01-16 MED ORDER — TIROFIBAN (AGGRASTAT) BOLUS VIA INFUSION
INTRAVENOUS | Status: DC | PRN
  Administered 2019-01-16: 2175 ug via INTRAVENOUS

## 2019-01-16 MED ORDER — TICAGRELOR 90 MG PO TABS
90.0000 mg | ORAL_TABLET | Freq: Two times a day (BID) | ORAL | Status: DC
  Administered 2019-01-17 – 2019-01-19 (×5): 90 mg via ORAL
  Filled 2019-01-16 (×5): qty 1

## 2019-01-16 MED ORDER — NITROGLYCERIN 1 MG/10 ML FOR IR/CATH LAB
INTRA_ARTERIAL | Status: AC
  Filled 2019-01-16: qty 10

## 2019-01-16 MED ORDER — CHLORHEXIDINE GLUCONATE CLOTH 2 % EX PADS
6.0000 | MEDICATED_PAD | Freq: Every day | CUTANEOUS | Status: DC
  Administered 2019-01-17 – 2019-01-18 (×2): 6 via TOPICAL

## 2019-01-16 MED ORDER — LIDOCAINE HCL (PF) 1 % IJ SOLN
INTRAMUSCULAR | Status: AC
  Filled 2019-01-16: qty 30

## 2019-01-16 MED ORDER — FENTANYL CITRATE (PF) 100 MCG/2ML IJ SOLN
INTRAMUSCULAR | Status: DC | PRN
  Administered 2019-01-16: 25 ug via INTRAVENOUS
  Administered 2019-01-16: 50 ug via INTRAVENOUS
  Administered 2019-01-16: 25 ug via INTRAVENOUS
  Administered 2019-01-16: 50 ug via INTRAVENOUS

## 2019-01-16 MED ORDER — ONDANSETRON HCL 4 MG/2ML IJ SOLN: 4.0000 mg | Freq: Four times a day (QID) | INTRAMUSCULAR | Status: DC | PRN

## 2019-01-16 MED ORDER — VERAPAMIL HCL 2.5 MG/ML IV SOLN
INTRAVENOUS | Status: DC | PRN
  Administered 2019-01-16: 10 mL via INTRA_ARTERIAL

## 2019-01-16 MED ORDER — TIROFIBAN HCL IN NACL 5-0.9 MG/100ML-% IV SOLN
INTRAVENOUS | Status: DC | PRN
  Administered 2019-01-16: 0.15 ug/kg/min via INTRAVENOUS

## 2019-01-16 MED ORDER — TICAGRELOR 90 MG PO TABS
ORAL_TABLET | ORAL | Status: DC | PRN
  Administered 2019-01-16: 180 mg via ORAL

## 2019-01-16 MED ORDER — HEPARIN SODIUM (PORCINE) 1000 UNIT/ML IJ SOLN
INTRAMUSCULAR | Status: DC | PRN
  Administered 2019-01-16: 2000 [IU] via INTRAVENOUS
  Administered 2019-01-16: 9000 [IU] via INTRAVENOUS

## 2019-01-16 MED ORDER — HEPARIN (PORCINE) IN NACL 1000-0.9 UT/500ML-% IV SOLN
INTRAVENOUS | Status: AC
  Filled 2019-01-16: qty 1000

## 2019-01-16 MED ORDER — ACETAMINOPHEN 325 MG PO TABS: 650.0000 mg | ORAL_TABLET | ORAL | Status: DC | PRN

## 2019-01-16 MED ORDER — HEPARIN SODIUM (PORCINE) 5000 UNIT/ML IJ SOLN
4000.0000 [IU] | Freq: Once | INTRAMUSCULAR | Status: AC
  Administered 2019-01-16: 4000 [IU] via INTRAVENOUS
  Filled 2019-01-16: qty 1

## 2019-01-16 MED ORDER — VERAPAMIL HCL 2.5 MG/ML IV SOLN
INTRAVENOUS | Status: AC
  Filled 2019-01-16: qty 2

## 2019-01-16 MED ORDER — MIDAZOLAM HCL 2 MG/2ML IJ SOLN
INTRAMUSCULAR | Status: DC | PRN
  Administered 2019-01-16 (×2): 1 mg via INTRAVENOUS

## 2019-01-16 MED ORDER — SODIUM CHLORIDE 0.9 % IV SOLN
INTRAVENOUS | Status: AC | PRN
  Administered 2019-01-16: 75 mL/h via INTRAVENOUS

## 2019-01-16 MED ORDER — LABETALOL HCL 5 MG/ML IV SOLN
10.0000 mg | INTRAVENOUS | Status: AC | PRN
  Administered 2019-01-17: 10 mg via INTRAVENOUS
  Filled 2019-01-16: qty 4

## 2019-01-16 SURGICAL SUPPLY — 28 items
BALLN  ~~LOC~~ SAPPHIRE 4.5X12 (BALLOONS) ×1
BALLN SAPPHIRE 2.5X15 (BALLOONS) ×2
BALLN SAPPHIRE 3.0X15 (BALLOONS) ×2
BALLN ~~LOC~~ EMERGE MR 3.5X12 (BALLOONS) ×2
BALLN ~~LOC~~ SAPPHIRE 4.5X12 (BALLOONS) ×1
BALLOON SAPPHIRE 2.5X15 (BALLOONS) ×1 IMPLANT
BALLOON SAPPHIRE 3.0X15 (BALLOONS) ×1 IMPLANT
BALLOON ~~LOC~~ EMERGE MR 3.5X12 (BALLOONS) ×1 IMPLANT
BALLOON ~~LOC~~ SAPPHIRE 4.5X12 (BALLOONS) ×1 IMPLANT
CATH 5FR JL3.5 JR4 ANG PIG MP (CATHETERS) ×2 IMPLANT
CATH LAUNCHER 6FR EBU 3 (CATHETERS) ×2 IMPLANT
CATH LAUNCHER 6FR EBU3.5 (CATHETERS) ×2 IMPLANT
CATH VISTA GUIDE 6FR XBRCA (CATHETERS) ×2 IMPLANT
DEVICE RAD COMP TR BAND LRG (VASCULAR PRODUCTS) ×2 IMPLANT
GLIDESHEATH SLEND SS 6F .021 (SHEATH) ×2 IMPLANT
GUIDEWIRE INQWIRE 1.5J.035X260 (WIRE) ×1 IMPLANT
INQWIRE 1.5J .035X260CM (WIRE) ×2
KIT ENCORE 26 ADVANTAGE (KITS) ×2 IMPLANT
KIT HEART LEFT (KITS) ×2 IMPLANT
KIT HEMO VALVE WATCHDOG (MISCELLANEOUS) ×2 IMPLANT
PACK CARDIAC CATHETERIZATION (CUSTOM PROCEDURE TRAY) ×2 IMPLANT
STENT SYNERGY DES 3X16 (Permanent Stent) ×2 IMPLANT
STENT SYNERGY DES 3X8 (Permanent Stent) ×2 IMPLANT
STENT SYNERGY DES 4X28 (Permanent Stent) ×2 IMPLANT
TRANSDUCER W/STOPCOCK (MISCELLANEOUS) ×2 IMPLANT
TUBING CIL FLEX 10 FLL-RA (TUBING) ×2 IMPLANT
WIRE ASAHI PROWATER 180CM (WIRE) ×4 IMPLANT
WIRE HI TORQ BMW 190CM (WIRE) ×2 IMPLANT

## 2019-01-16 NOTE — Progress Notes (Signed)
Patient concerned about his wife- she has fought cancer and she is waiting in the car as he prepares to have heart cath.  Nurse provided phone and let patient call her to speak with her before going to cath lab.  Chaplain  Listened and discerned patient concerned more for his wife than himself. Had prayer for peace and calm and it was time to move up to the cath lab.    01/16/19 2000  Clinical Encounter Type  Visited With Patient;Health care provider  Visit Type Initial;Spiritual support;Pre-op  Referral From Care management  Consult/Referral To Chaplain  Spiritual Encounters  Spiritual Needs Prayer;Emotional  Stress Factors  Patient Stress Factors Health changes  Family Stress Factors Other (Comment) (wife no visitors)

## 2019-01-16 NOTE — H&P (Signed)
Cardiology Admission History and Physical:   Patient ID: KALYB PEMBLE; MRN: [id number]; DOB: [birth year]   Admission date: 01/16/2019  Primary Care Provider: Jani Gravel, MD Primary Cardiologist: No primary care provider on file.   Chief Complaint:  Acute onset of chest pain  History of Present Illness:   Adam Farmer is a 68 y.o. male with a history of diabetes mellitus, current smoking and hyperlipidemia presents with acute onset of substernal chest pain starting at around 5 pm today. The pain started while he was at the driving range. Described as 8/10 in intensity and over the left precordium. No radiation of the pain. He states that it felt like indigestion. The patient took some ibuprofen and antacids without relief. He also had some tingling in the left arm and a feeling of nausea. No vomiting and no diaphoresis. In the ED he was given 324mg  of ASA. The ECG revealed ST elevations in the inferior and anterior leads. He also received a bolus of IV heparin.  COVID screen: No fevers or chills. No recent travel or sick contacts.  He believes he had a stress test a few years ago and that it was negative. He takes metformin for his diabetes. He is not on ASA. He attempts to walk everyday. No exertional symptoms. Has had infrequent chest pain in the past.     Past Medical History:  Diagnosis Date   ABNORMAL GLUCOSE NEC 05/03/2007   ANXIETY 05/03/2007   COLONIC POLYPS, HX OF 07/09/2009   HYPERLIPIDEMIA, MIXED 05/03/2007   TOBACCO USE 05/03/2007    Past Surgical History:  Procedure Laterality Date   Fractured elbow, nose and toe     WISDOM TOOTH EXTRACTION       Medications Prior to Admission: Prior to Admission medications   Medication Sig Start Date End Date Taking? Authorizing Provider  escitalopram (LEXAPRO) 10 MG tablet Take 10 mg by mouth daily.    [provider]  metFORMIN (GLUCOPHAGE) 500 MG tablet Take 500 mg by mouth 2 (two) times daily with a meal.       [provider]     Allergies:    Allergies  Allergen Reactions   Latex     REACTION: itching    Social History:   Social History   Socioeconomic History   Marital status: Married    Spouse name: Not on file   Number of children: 2   Years of education: 12   Highest education level: Not on file  Occupational History   Occupation: trucking Therapist, nutritional  Social Needs   Financial resource strain: Not on file   Food insecurity:    Worry: Not on file    Inability: Not on file   Transportation needs:    Medical: Not on file    Non-medical: Not on file  Tobacco Use   Smoking status: Current Every Day Smoker    Packs/day: 1.00    Years: 26.00    Pack years: 26.00    Types: Cigarettes   Smokeless tobacco: Never Used   Tobacco comment: discussed strategy  Substance and Sexual Activity   Alcohol use: Yes    Alcohol/week: 1.0 - 2.0 standard drinks    Types: 1 - 2 Standard drinks or equivalent per week   Drug use: No   Sexual activity: Yes    Partners: Female  Lifestyle   Physical activity:    Days per week: Not on file    Minutes per session: Not on file  Stress: Not on file  Relationships   Social connections:    Talks on phone: Not on file    Gets together: Not on file    Attends religious service: Not on file    Active member of club or organization: Not on file    Attends meetings of clubs or organizations: Not on file    Relationship status: Not on file   Intimate partner violence:    Fear of current or ex partner: Not on file    Emotionally abused: Not on file    Physically abused: Not on file    Forced sexual activity: Not on file  Other Topics Concern   Not on file  Social History Narrative   Married- '76 (dated since she was 47). Son-'86, graduated [employer]  In [place]; daughter-'81 -  MSW, hospital Education officer, museum. Wife-fair health: HTN, torn retina. 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      Family History:  The patient's family history includes Colon polyps in his father; Diabetes in his father and paternal grandmother; Hyperlipidemia in his mother; Lung cancer in his father. There is no history of Cancer or Heart disease.     Review of Systems: [y] = yes, [ ]  = no    General: Weight gain [ ] ; Weight loss [ ] ; Anorexia [ ] ; Fatigue [ ] ; Fever [ ] ; Chills [ ] ; Weakness [ ]    Cardiac: Chest pain/pressure [Y]; Resting SOB [ ] ; Exertional SOB [ ] ; Orthopnea [ ] ; Pedal Edema [ ] ; Palpitations [ ] ; Syncope [ ] ; Presyncope [ ] ; Paroxysmal nocturnal dyspnea[ ]    Pulmonary: Cough [ ] ; Wheezing[ ] ; Hemoptysis[ ] ; Sputum [ ] ; Snoring [ ]    GI: Vomiting[ ] ; Dysphagia[ ] ; Melena[ ] ; Hematochezia [ ] ; Heartburn[ ] ; Abdominal pain [ ] ; Constipation [ ] ; Diarrhea [ ] ; BRBPR [ ]    GU: Hematuria[ ] ; Dysuria [ ] ; Nocturia[ ]    Vascular: Pain in legs with walking [ ] ; Pain in feet with lying flat [ ] ; Non-healing sores [ ] ; Stroke [ ] ; TIA [ ] ; Slurred speech [ ] ;   Neuro: Headaches[ ] ; Vertigo[ ] ; Seizures[ ] ; Paresthesias[ ] ;Blurred vision [ ] ; Diplopia [ ] ; Vision changes [ ]    Ortho/Skin: Arthritis [ ] ; Joint pain [ ] ; Muscle pain [ ] ; Joint swelling [ ] ; Back Pain [ ] ; Rash [ ]    Psych: Depression[ ] ; Anxiety[ ]    Heme: Bleeding problems [ ] ; Clotting disorders [ ] ; Anemia [ ]    Endocrine: Diabetes [ ] ; Thyroid dysfunction[ ]      Physical Exam/Data:   Vitals:   01/16/19 1942  BP: (!) 150/93  Pulse: 82  Resp: (!) 21  Temp: 97.9 F (36.6 C)  TempSrc: Oral  SpO2: 96%   No intake or output data in the 24 hours ending 01/16/19 2006 There were no vitals filed for this visit. There is no height or weight on file to calculate BMI.  General:  Well nourished, well developed, in no acute distress   HEENT: normal Lymph: no adenopathy Neck: no JVD Endocrine:  No thryomegaly Vascular: No carotid bruits; FA pulses 2+ bilaterally without bruits  Cardiac:  normal  S1, S2; RRR; no murmur  Lungs:  clear to auscultation bilaterally, no wheezing, rhonchi or rales  Abd: soft, nontender, no hepatomegaly  Ext: no edema Musculoskeletal:  No deformities, BUE and BLE strength normal and equal Skin: warm and dry  Neuro:  CNs 2-12 intact,  no focal abnormalities noted Psych:  Normal affect    EKG:  The ECG that was done today was personally reviewed and demonstrates ST elevations in II, III, aVF, V3-4.   Laboratory Data:  ChemistryNo results for input(s): NA, K, CL, CO2, GLUCOSE, BUN, CREATININE, CALCIUM, GFRNONAA, GFRAA, ANIONGAP in the last 168 hours.  No results for input(s): PROT, ALBUMIN, AST, ALT, ALKPHOS, BILITOT in the last 168 hours. Hematology Recent Labs  Lab 01/16/19 1943  WBC 15.5*  RBC 4.98  HGB 15.4  HCT 45.7  MCV 91.8  MCH 30.9  MCHC 33.7  RDW 13.5  PLT 201   Cardiac EnzymesNo results for input(s): TROPONINI in the last 168 hours. No results for input(s): TROPIPOC in the last 168 hours.  BNPNo results for input(s): BNP, PROBNP in the last 168 hours.  DDimer No results for input(s): DDIMER in the last 168 hours.  Radiology/Studies:  No results found.  Assessment and Plan:   1. ST Elevation myocardial infarction The patient presents with acute onset of chest pain (starting at 5 pm). ECG in the ED revealed ST elevations in the inferior leads as well as V3-V4. STEMI team was activated for emergent cardiac cath.  - The patient has the following risk factors for CAD: age, diabetes mellitus, and current history of smoking - CXR performed - results pending. - Continue to trend cardiac biomarkers and obtain serial ECGs - Aspirin 81 mg daily - Start high dose statins (such as Atorvastatin 80 mg nightly). Check a lipid panel in the morning. - Start intravenous unfractionated heparin per pharmacy protocol. - Recommend cardiac catheterization to define the coronary anatomy. - Cardiac catheterization was discussed with the patient fully.  The patient understands that risks include but are not limited to stroke (1 in 1000), death (1 in 41), kidney failure [usually temporary] (1 in 500), bleeding (1 in 200), allergic reaction [possibly serious] (1 in 200).  The patient understands and is willing to proceed.   - Keep the patient NPO (except medications)    Severity of Illness: The appropriate patient status for this patient is INPATIENT. Inpatient status is judged to be reasonable and necessary in order to provide the required intensity of service to ensure the patient's safety. The patient's presenting symptoms, physical exam findings, and initial radiographic and laboratory data in the context of their chronic comorbidities is felt to place them at high risk for further clinical deterioration. Furthermore, it is not anticipated that the patient will be medically stable for discharge from the hospital within 2 midnights of admission. The following factors support the patient status of inpatient.   " The patient's presenting symptoms include chest pain. " The physical exam findings include normal cardiac auscultation. " The initial radiographic and laboratory data are worrisome because of abnormal ECG with ST elevations " The chronic co-morbidities include diabetes mellitus and current smoking.   * I certify that at the point of admission it is my clinical judgment that the patient will require inpatient hospital care spanning beyond 2 midnights from the point of admission due to high intensity of service, high risk for further deterioration and high frequency of surveillance required.*    For questions or updates, please contact [place] Please consult [email or web address] for contact info under Cardiology/STEMI.    Signed, Meade Maw, MD  01/16/2019 8:06 PM

## 2019-01-16 NOTE — ED Notes (Signed)
Pt using cell phone to contact wife with updates.

## 2019-01-16 NOTE — ED Notes (Signed)
Cardiology @ bedside.

## 2019-01-16 NOTE — ED Notes (Signed)
Called [facility] to activate code stemi  

## 2019-01-16 NOTE — ED Provider Notes (Signed)
[facility] EMERGENCY DEPARTMENT Provider Note   CSN: [id number] Arrival date & time: 01/16/19  1904    History   Chief Complaint Chief Complaint  Patient presents with   Chest Pain    HPI Adam Farmer is a 68 y.o. male with past medical history of hyperlipidemia, diabetes, smoking, presenting to the emergency department with left-sided chest pain that began at 5 PM this evening.  Patient states he just finished at the driving range and was getting in his car to go home when he developed a left-sided chest pain that was severe in nature, described as a indigestion feeling.  He had some numbness sensation in his left arm as well with nausea.  He states at home he took an antacid pill to help symptoms, however did not improve.  Upon arrival to the emergency department he rates his pain as 8/10 severity.  Upon evaluation, patient states pain is significantly improved to 3-4/10 severity.  No history of CAD.  He reports a stress test about 4 years ago that he thinks was normal.  No other associated symptoms or complaints.  He states in the past he has had a similar episode where he felt like it was indigestion, however resolved without intervention.     The history is provided by the patient.    Past Medical History:  Diagnosis Date   ABNORMAL GLUCOSE NEC 05/03/2007   ANXIETY 05/03/2007   COLONIC POLYPS, HX OF 07/09/2009   HYPERLIPIDEMIA, MIXED 05/03/2007   TOBACCO USE 05/03/2007    Patient Active Problem List   Diagnosis Date Noted   Routine health maintenance 09/05/2011   COLONIC POLYPS, HX OF 07/09/2009   HYPERLIPIDEMIA, MIXED 05/03/2007   ANXIETY 05/03/2007   TOBACCO USE 05/03/2007   ABNORMAL GLUCOSE NEC 05/03/2007    Past Surgical History:  Procedure Laterality Date   Fractured elbow, nose and toe     WISDOM TOOTH EXTRACTION          Home Medications    Prior to Admission medications   Medication Sig Start Date End Date Taking?  Authorizing Provider  escitalopram (LEXAPRO) 10 MG tablet Take 10 mg by mouth daily.    [provider]  metFORMIN (GLUCOPHAGE) 500 MG tablet Take 500 mg by mouth 2 (two) times daily with a meal.      [provider]    Family History Family History  Problem Relation Age of Onset   Hyperlipidemia Mother    Colon polyps Father    Lung cancer Father    Diabetes Father    Diabetes Paternal Grandmother    Cancer Neg Hx    Heart disease Neg Hx     Social History Social History   Tobacco Use   Smoking status: Current Every Day Smoker    Packs/day: 1.00    Years: 26.00    Pack years: 26.00    Types: Cigarettes   Smokeless tobacco: Never Used   Tobacco comment: discussed strategy  Substance Use Topics   Alcohol use: Yes    Alcohol/week: 1.0 - 2.0 standard drinks    Types: 1 - 2 Standard drinks or equivalent per week   Drug use: No     Allergies   Latex   Review of Systems Review of Systems  Cardiovascular: Positive for chest pain.  Gastrointestinal: Positive for nausea.  All other systems reviewed and are negative.    Physical Exam Updated Vital Signs BP (!) 150/93    Pulse 82  Temp 97.9 F (36.6 C) (Oral)    Resp (!) 21    SpO2 96%   Physical Exam Vitals signs and nursing note reviewed.  Constitutional:      General: He is not in acute distress.    Appearance: He is well-developed. He is not ill-appearing.  HENT:     Head: Normocephalic and atraumatic.  Eyes:     Conjunctiva/sclera: Conjunctivae normal.  Cardiovascular:     Rate and Rhythm: Normal rate and regular rhythm.  Pulmonary:     Effort: Pulmonary effort is normal. No respiratory distress.     Breath sounds: Normal breath sounds.  Abdominal:     General: Bowel sounds are normal.     Palpations: Abdomen is soft.     Tenderness: There is no abdominal tenderness. There is no guarding or rebound.  Musculoskeletal:     Right lower leg: No edema.     Left lower leg:  No edema.  Skin:    General: Skin is warm.  Neurological:     Mental Status: He is alert.  Psychiatric:        Behavior: Behavior normal.      ED Treatments / Results  Labs (all labs ordered are listed, but only abnormal results are displayed) Labs Reviewed  CBC - Abnormal; Notable for the following components:      Result Value   WBC 15.5 (*)    All other components within normal limits  HEPARIN LEVEL (UNFRACTIONATED) - Abnormal; Notable for the following components:   Heparin Unfractionated <0.10 (*)    All other components within normal limits  SARS CORONAVIRUS 2 (HOSPITAL ORDER, [facility])  BASIC METABOLIC PANEL  TROPONIN I    EKG None  Radiology Dg Chest Portable 1 View  Result Date: 01/16/2019 CLINICAL DATA:  Chest pain EXAM: PORTABLE CHEST 1 VIEW COMPARISON:  10/12/2016 FINDINGS: The heart size is normal. There are multiple old healed left-sided rib fractures. There is a lucency coursing across the patient's right upper thorax which is favored to be secondary to artifact, much less likely pneumothorax. There is no large pleural effusion. No focal area of consolidation. No acute osseous abnormality. IMPRESSION: No active disease. Electronically Signed   By: Constance Holster M.D.   On: 01/16/2019 20:06    Procedures Procedures (including critical care time) CRITICAL CARE Performed by: [place] N Ahtziry Saathoff   Total critical care time: 30 minutes  Critical care time was exclusive of separately billable procedures and treating other patients.  Critical care was necessary to treat or prevent imminent or life-threatening deterioration.  Critical care was time spent personally by me on the following activities: development of treatment plan with patient and/or surrogate as well as nursing, discussions with consultants, evaluation of patient's response to treatment, examination of patient, obtaining history from patient or surrogate, ordering and  performing treatments and interventions, ordering and review of laboratory studies, ordering and review of radiographic studies, pulse oximetry and re-evaluation of patient's condition.  Medications Ordered in ED Medications  sodium chloride flush (NS) 0.9 % injection 3 mL (3 mLs Intravenous Given 01/16/19 1939)  aspirin chewable tablet 324 mg (324 mg Oral Given 01/16/19 1948)  heparin injection 4,000 Units (4,000 Units Intravenous Given 01/16/19 1955)     Initial Impression / Assessment and Plan / ED Course  I have reviewed the triage vital signs and the nursing notes.  Pertinent labs & imaging results that were available during my care of the patient were  reviewed by me and considered in my medical decision making (see chart for details).       Patient presenting with left-sided chest pain that began as he was leaving the driving range this evening at 5 PM.  Symptoms radiated into the left arm, described as a numbness.   Code STEMI was initiated upon obtaining EKG with ST elevation in inferior and anterior leads.  Labs obtained.  325 mg of aspirin and heparin injection administered.  Hemodynamically stable on evaluation, complains of minimal pain. Cardiology consulted and evaluated patient in the ED, will take patient to the Cath Lab for intervention this evening.  Patient discussed with and evaluated by Dr. Lita Mains.  Final Clinical Impressions(s) / ED Diagnoses   Final diagnoses:  ST elevation myocardial infarction (STEMI) involving other coronary artery of inferior wall [facility])    ED Discharge Orders    None       Genesis Paget, [place] N, PA-C 01/16/19 2251    Julianne Rice, MD 01/21/19 [phone]

## 2019-01-16 NOTE — ED Triage Notes (Signed)
Pt states he has had chest pain radiating to left arm with numbness that began about 1700 today.  Nausea, no vomiting. No SHOB. No fever.

## 2019-01-17 ENCOUNTER — Encounter (HOSPITAL_COMMUNITY): Payer: Self-pay | Admitting: Interventional Cardiology

## 2019-01-17 ENCOUNTER — Inpatient Hospital Stay (HOSPITAL_COMMUNITY): Payer: BC Managed Care – PPO

## 2019-01-17 DIAGNOSIS — E118 Type 2 diabetes mellitus with unspecified complications: Secondary | ICD-10-CM

## 2019-01-17 DIAGNOSIS — I361 Nonrheumatic tricuspid (valve) insufficiency: Secondary | ICD-10-CM

## 2019-01-17 DIAGNOSIS — E785 Hyperlipidemia, unspecified: Secondary | ICD-10-CM

## 2019-01-17 DIAGNOSIS — I2109 ST elevation (STEMI) myocardial infarction involving other coronary artery of anterior wall: Principal | ICD-10-CM

## 2019-01-17 LAB — ECHOCARDIOGRAM COMPLETE
Height: 72 in
Weight: 2765.45 oz

## 2019-01-17 LAB — GLUCOSE, CAPILLARY
Glucose-Capillary: 253 mg/dL — ABNORMAL HIGH (ref 70–99)
Glucose-Capillary: 147 mg/dL — ABNORMAL HIGH (ref 70–99)
Glucose-Capillary: 148 mg/dL — ABNORMAL HIGH (ref 70–99)

## 2019-01-17 LAB — POCT I-STAT 7, (LYTES, BLD GAS, ICA,H+H)
Acid-base deficit: 4 mmol/L — ABNORMAL HIGH (ref 0.0–2.0)
Bicarbonate: 21.5 mmol/L (ref 20.0–28.0)
Calcium, Ion: 1.19 mmol/L (ref 1.15–1.40)
HCT: 46 % (ref 39.0–52.0)
Hemoglobin: 15.6 g/dL (ref 13.0–17.0)
O2 Saturation: 97 %
Potassium: 4.1 mmol/L (ref 3.5–5.1)
Sodium: 136 mmol/L (ref 135–145)
TCO2: 23 mmol/L (ref 22–32)
pCO2 arterial: 39.2 mmHg (ref 32.0–48.0)
pH, Arterial: 7.348 — ABNORMAL LOW (ref 7.350–7.450)
pO2, Arterial: 100 mmHg (ref 83.0–108.0)

## 2019-01-17 LAB — CBC
HCT: 43.4 % (ref 39.0–52.0)
Hemoglobin: 15 g/dL (ref 13.0–17.0)
MCH: 30.9 pg (ref 26.0–34.0)
MCHC: 34.6 g/dL (ref 30.0–36.0)
MCV: 89.5 fL (ref 80.0–100.0)
Platelets: 184 10*3/uL (ref 150–400)
RBC: 4.85 MIL/uL (ref 4.22–5.81)
RDW: 13.6 % (ref 11.5–15.5)
WBC: 13.2 10*3/uL — ABNORMAL HIGH (ref 4.0–10.5)
nRBC: 0 % (ref 0.0–0.2)

## 2019-01-17 LAB — POCT I-STAT CREATININE: Creatinine, Ser: 1 mg/dL (ref 0.61–1.24)

## 2019-01-17 LAB — BASIC METABOLIC PANEL
Anion gap: 10 (ref 5–15)
BUN: 9 mg/dL (ref 8–23)
CO2: 22 mmol/L (ref 22–32)
Calcium: 8.9 mg/dL (ref 8.9–10.3)
Chloride: 105 mmol/L (ref 98–111)
Creatinine, Ser: 1.1 mg/dL (ref 0.61–1.24)
GFR calc Af Amer: >60 mL/min (ref >60)
GFR calc non Af Amer: >60 mL/min (ref >60)
Glucose, Bld: 150 mg/dL — ABNORMAL HIGH (ref 70–99)
Potassium: 3.7 mmol/L (ref 3.5–5.1)
Sodium: 137 mmol/L (ref 135–145)

## 2019-01-17 LAB — LIPID PANEL
Cholesterol: 149 mg/dL (ref 0–200)
HDL: 33 mg/dL — ABNORMAL LOW (ref >40)
LDL Cholesterol: 80 mg/dL (ref 0–99)
Total CHOL/HDL Ratio: 4.5 RATIO
Triglycerides: 180 mg/dL — ABNORMAL HIGH (ref <150)
VLDL: 36 mg/dL (ref 0–40)

## 2019-01-17 LAB — TROPONIN I
Troponin I: 0.26 ng/mL (ref <0.03)
Troponin I: 1.87 ng/mL (ref <0.03)
Troponin I: 5.75 ng/mL (ref <0.03)

## 2019-01-17 LAB — HEMOGLOBIN A1C
Hgb A1c MFr Bld: 7.5 % — ABNORMAL HIGH (ref 4.8–5.6)
Mean Plasma Glucose: 168.55 mg/dL

## 2019-01-17 LAB — POCT ACTIVATED CLOTTING TIME
Activated Clotting Time: 285 seconds
Activated Clotting Time: 521 seconds
Activated Clotting Time: 312 seconds

## 2019-01-17 LAB — HIV ANTIBODY (ROUTINE TESTING W REFLEX): HIV Screen 4th Generation wRfx: NONREACTIVE

## 2019-01-17 LAB — PROTIME-INR
INR: 1.1 (ref 0.8–1.2)
Prothrombin Time: 13.7 seconds (ref 11.4–15.2)

## 2019-01-17 LAB — MRSA PCR SCREENING: MRSA by PCR: NEGATIVE

## 2019-01-17 MED ORDER — MORPHINE SULFATE (PF) 2 MG/ML IV SOLN
2.0000 mg | Freq: Once | INTRAVENOUS | Status: AC
  Administered 2019-01-17: 2 mg via INTRAVENOUS
  Filled 2019-01-17: qty 1

## 2019-01-17 MED ORDER — LISINOPRIL 5 MG PO TABS
5.0000 mg | ORAL_TABLET | Freq: Every day | ORAL | Status: DC
  Administered 2019-01-17 – 2019-01-19 (×3): 5 mg via ORAL
  Filled 2019-01-17 (×3): qty 1

## 2019-01-17 MED ORDER — ASPIRIN EC 81 MG PO TBEC
81.0000 mg | DELAYED_RELEASE_TABLET | Freq: Every day | ORAL | Status: DC
  Administered 2019-01-18 – 2019-01-19 (×2): 81 mg via ORAL
  Filled 2019-01-17 (×2): qty 1

## 2019-01-17 MED ORDER — ACETAMINOPHEN 325 MG PO TABS: 650.0000 mg | ORAL_TABLET | ORAL | Status: DC | PRN

## 2019-01-17 MED ORDER — NITROGLYCERIN 0.4 MG SL SUBL: 0.4000 mg | SUBLINGUAL_TABLET | SUBLINGUAL | Status: DC | PRN

## 2019-01-17 MED ORDER — HYDRALAZINE HCL 25 MG PO TABS
25.0000 mg | ORAL_TABLET | Freq: Three times a day (TID) | ORAL | Status: DC
  Administered 2019-01-17 – 2019-01-19 (×6): 25 mg via ORAL
  Filled 2019-01-17 (×6): qty 1

## 2019-01-17 MED ORDER — ONDANSETRON HCL 4 MG/2ML IJ SOLN: 4.0000 mg | Freq: Four times a day (QID) | INTRAMUSCULAR | Status: DC | PRN

## 2019-01-17 MED ORDER — INSULIN ASPART 100 UNIT/ML ~~LOC~~ SOLN: 0.0000 [IU] | Freq: Every day | SUBCUTANEOUS | Status: DC

## 2019-01-17 MED ORDER — INSULIN ASPART 100 UNIT/ML ~~LOC~~ SOLN
0.0000 [IU] | Freq: Three times a day (TID) | SUBCUTANEOUS | Status: DC
  Administered 2019-01-17: 1 [IU] via SUBCUTANEOUS
  Administered 2019-01-17: 5 [IU] via SUBCUTANEOUS
  Administered 2019-01-18: 2 [IU] via SUBCUTANEOUS
  Administered 2019-01-18: 1 [IU] via SUBCUTANEOUS
  Administered 2019-01-18: 2 [IU] via SUBCUTANEOUS
  Administered 2019-01-19: 3 [IU] via SUBCUTANEOUS

## 2019-01-17 MED ORDER — ATORVASTATIN CALCIUM 80 MG PO TABS
80.0000 mg | ORAL_TABLET | Freq: Every day | ORAL | Status: DC
  Administered 2019-01-17 – 2019-01-18 (×2): 80 mg via ORAL
  Filled 2019-01-17 (×2): qty 1

## 2019-01-17 NOTE — Progress Notes (Signed)
Pt complaining of 2/10 chest pain/pressure that has not resolved since cardiac catheterization. Pt refusing NTG. Cardiology fellow paged, orders received for one time dose of 2mg  morphine sulfate.  Sherlie Ban, RN

## 2019-01-17 NOTE — Progress Notes (Signed)
Pt had 29 beat run of Vtach, asymptomatic. Will continue to monitor.  Sherlie Ban, RN

## 2019-01-17 NOTE — Progress Notes (Signed)
Progress Note  Patient Name: Adam Farmer Date of Encounter: 01/17/2019  Primary Cardiologist: new; Dr. Irish Lack  Subjective   No recurrent chest pain; Pt had developed chest pain shortly after leaving the driving range yesterday.  Inpatient Medications    Scheduled Meds: . [START ON 01/18/2019] aspirin EC  81 mg Oral Daily  . atorvastatin  80 mg Oral q1800  . Chlorhexidine Gluconate Cloth  6 each Topical Q0600  . escitalopram  10 mg Oral Daily  . metoprolol tartrate  12.5 mg Oral BID  . sodium chloride flush  3 mL Intravenous Q12H  . ticagrelor  90 mg Oral BID   Continuous Infusions: . sodium chloride     PRN Meds: sodium chloride, acetaminophen, nitroGLYCERIN, ondansetron (ZOFRAN) IV, sodium chloride flush   Vital Signs    Vitals:   01/17/19 0800 01/17/19 0806 01/17/19 0900 01/17/19 1000  BP:  (!) 163/103 (!) 154/105 (!) 158/100  Pulse:  71 62 60  Resp: 17 (!) 21 (!) 21 (!) 21  Temp:  97.9 F (36.6 C)    TempSrc:  Oral    SpO2: 97% 97% 96% 98%  Weight:      Height:        Intake/Output Summary (Last 24 hours) at 01/17/2019 1035 Last data filed at 01/17/2019 1000 Gross per 24 hour  Intake 1179.26 ml  Output 1150 ml  Net 29.26 ml    I/O since admission: 29  Filed Weights   01/16/19 2245  Weight: 78.4 kg    Telemetry    Sinus at 69 - Personally Reviewed  ECG    01/17/2019 ECG (independently read by me): NSR 64 with resolution of miled STE; early transition V2.  01/16/2019 ECG (independently read by me): NSR 70. Early STE enferioly  Physical Exam    BP (!) 158/100   Pulse 60   Temp 97.9 F (36.6 C) (Oral)   Resp (!) 21   Ht 6' (1.829 m)   Wt 78.4 kg   SpO2 98%   BMI 23.44 kg/m  General: Alert, oriented, no distress.  Skin: normal turgor, no rashes, warm and dry HEENT: Normocephalic, atraumatic. Pupils equal round and reactive to light; sclera anicteric; extraocular muscles intact; Nose without nasal septal hypertrophy Mouth/Parynx benign;   Neck: No JVD, no carotid bruits; normal carotid upstroke Lungs: clear to ausculatation and percussion; no wheezing or rales Chest wall: without tenderness to palpitation Heart: PMI not displaced, RRR, s1 s2 normal, 1/6 systolic murmur, no diastolic murmur, no rubs, gallops, thrills, or heaves Abdomen: soft, nontender; no hepatosplenomehaly, BS+; abdominal aorta nontender and not dilated by palpation. Back: no CVA tenderness Pulses 2+ Musculoskeletal: full range of motion, normal strength, no joint deformities Extremities: no clubbing cyanosis or edema, Homan's sign negative  Neurologic: grossly nonfocal; Cranial nerves grossly wnl Psychologic: Normal mood and affect   Labs    Chemistry Recent Labs  Lab 01/16/19 1943 01/17/19 0635  NA 134* 137  K 3.7 3.7  CL 99 105  CO2 21* 22  GLUCOSE 196* 150*  BUN 15 9  CREATININE 1.36* 1.10  CALCIUM 9.0 8.9  GFRNONAA 54* >60  GFRAA >60 >60  ANIONGAP 14 10     Hematology Recent Labs  Lab 01/16/19 1943 01/17/19 0635  WBC 15.5* 13.2*  RBC 4.98 4.85  HGB 15.4 15.0  HCT 45.7 43.4  MCV 91.8 89.5  MCH 30.9 30.9  MCHC 33.7 34.6  RDW 13.5 13.6  PLT 201 184    Cardiac  Enzymes Recent Labs  Lab 01/16/19 1943 01/16/19 2328 01/17/19 0635  TROPONINI <0.03 0.26* 1.87*   No results for input(s): TROPIPOC in the last 168 hours.   BNPNo results for input(s): BNP, PROBNP in the last 168 hours.   DDimer No results for input(s): DDIMER in the last 168 hours.   Lipid Panel     Component Value Date/Time   CHOL 149 01/17/2019 0635   TRIG 180 (H) 01/17/2019 0635   HDL 33 (L) 01/17/2019 0635   CHOLHDL 4.5 01/17/2019 0635   VLDL 36 01/17/2019 0635   LDLCALC 80 01/17/2019 0635   LDLDIRECT 150.5 07/03/2009 1127     Radiology    Dg Chest Portable 1 View  Result Date: 01/16/2019 CLINICAL DATA:  Chest pain EXAM: PORTABLE CHEST 1 VIEW COMPARISON:  10/12/2016 FINDINGS: The heart size is normal. There are multiple old healed left-sided  rib fractures. There is a lucency coursing across the patient's right upper thorax which is favored to be secondary to artifact, much less likely pneumothorax. There is no large pleural effusion. No focal area of consolidation. No acute osseous abnormality. IMPRESSION: No active disease. Electronically Signed   By: Constance Holster M.D.   On: 01/16/2019 20:06    Cardiac Studies    Prox Cx to Mid Cx lesion is 25% stenosed.  Dist LM lesion is 20% stenosed.  Ost LAD lesion is 25% stenosed.  Mid LAD-1 lesion is 99% stenosed.  A drug-eluting stent was successfully placed using a STENT SYNERGY DES 3X16.  Post intervention, there is a 0% residual stenosis.  Mid LAD-2 lesion is 75% stenosed, edge dissection.  A drug-eluting stent was successfully placed using a STENT SYNERGY DES 3X8 for the distal edge dissection, overlapping the 16 mm stent.  Post intervention, there is a 0% residual stenosis.  Mid RCA-1 lesion is 90% stenosed.  A drug-eluting stent was successfully placed using a STENT SYNERGY DES 4X28.  Post intervention, there is a 0% residual stenosis.  The left ventricular systolic function is normal.  LV end diastolic pressure is normal.  The left ventricular ejection fraction is 55-65% by visual estimate.  There is no aortic valve stenosis.   Continue aggressive secondary prevention.    Start Metoprolol and high dose atorvastatin.   Watch in ICU.  Findings discussed with the wife.     Intervention     Patient Profile     68 y.o. male with a history of DM, HTN, tobacco abuse who presented 6/8/202 with early STEMI and underwent cath and DES stents to LAD and RCA.    Assessment & Plan    1.  ACS:  s/p DES stent to LAD and RCA. Hopefully there will be good recovery of LV fxn with ECG today fairly normal.  Echo done, results pending. Troponin 1.87. DAPT for a minimum of year.   2. Tobacco abuse > 30 years; discussed cessation.  3. DM: on metforman and amaryl  PTA.  Meds on hold post cath.  4. Hyperlidipemia:  Now on atorvastatin 80 mg  5.  Hypertension:  Not on meds PTA.  BP today 158/90;  Will add metoprolol and lisinopril post MI   Will keep in CCU today and transfer to telemetry tomorrow if stable.  Signed, Troy Sine, MD, [facility] 01/17/2019, 10:35 AM

## 2019-01-17 NOTE — Progress Notes (Signed)
TR band removed at this time. Site level 1, mild hematoma, no external bleeding noted. Good capillary refill and pulse. Pt educated on site care and to notify RN if any s/s bleeding.  Sherlie Ban, RN

## 2019-01-17 NOTE — Progress Notes (Signed)
  Echocardiogram 2D Echocardiogram has been performed.  Adam Farmer R 01/17/2019, 10:00 AM

## 2019-01-17 NOTE — Progress Notes (Signed)
CRITICAL VALUE ALERT  Critical Value:  Troponin 0.26  Date & Time Notied:  01/17/19 @ 12:25 AM  Provider Notified: Expected value.  Orders Received/Actions taken: N/A  Sherlie Ban, RN

## 2019-01-17 NOTE — Progress Notes (Signed)
Harlan Stains, NP notified of patient's SBP 160s. She will write orders for hydralazine.

## 2019-01-17 NOTE — Progress Notes (Signed)
CARDIAC REHAB PHASE I   PRE:  Rate/Rhythm: 70 SR  BP:  Supine: 154/94  Sitting:   Standing:    SaO2: 95%RA  MODE:  Ambulation: to bathroom and to recliner ft   1445-1520 Assisted pt to bathroom and then to recliner with call bell. Gave MI booklet and discussed restrictions. Stressed importance of brilinta with stent. Gave stent card. Gave heart healthy diet for pt to read. Pt had questions re ex. Gave walking instructions and discussed CRP 2. Referred to [place] program. Will follow up tomorrow. Pt is interested in participating in Virtual Cardiac Rehab. Pt advised that Virtual Cardiac Rehab is provided at no cost to the patient.  Checklist:  1. Pt has smart device  ie smartphone and/or ipad for downloading an app  Yes 2. Reliable internet/wifi service    Yes 3. Understands how to use their smartphone and navigate within an app.  Yes 4.  Reviewed with pt the scheduling process for virtual cardiac rehab.  Pt verbalized understanding.     Graylon Good, RN BSN  01/17/2019 3:14 PM

## 2019-01-17 NOTE — Progress Notes (Signed)
Wife called and updated on patient's status. All questions answered. She will be bringing patient's cell phone charger to the hospital soon.

## 2019-01-17 NOTE — Progress Notes (Signed)
CRITICAL VALUE ALERT  Critical Value:  Troponin 5.75  Date & Time Notied:  01/17/2019 1625  Provider Notified: Harlan Stains, NP  Orders Received/Actions taken: none

## 2019-01-17 NOTE — Care Management (Signed)
Brilinta benefits check sent and pending.  Makesha Belitz RN, BSN, NCM-BC, ACM-RN [phone] 

## 2019-01-17 NOTE — Progress Notes (Signed)
Claiborne Billings MD notified of elevated troponin. No new orders.

## 2019-01-18 DIAGNOSIS — Z720 Tobacco use: Secondary | ICD-10-CM

## 2019-01-18 DIAGNOSIS — I10 Essential (primary) hypertension: Secondary | ICD-10-CM

## 2019-01-18 LAB — GLUCOSE, CAPILLARY
Glucose-Capillary: 161 mg/dL — ABNORMAL HIGH (ref 70–99)
Glucose-Capillary: 194 mg/dL — ABNORMAL HIGH (ref 70–99)
Glucose-Capillary: 125 mg/dL — ABNORMAL HIGH (ref 70–99)
Glucose-Capillary: 116 mg/dL — ABNORMAL HIGH (ref 70–99)

## 2019-01-18 LAB — BASIC METABOLIC PANEL
Anion gap: 10 (ref 5–15)
BUN: 9 mg/dL (ref 8–23)
CO2: 24 mmol/L (ref 22–32)
Calcium: 9.6 mg/dL (ref 8.9–10.3)
Chloride: 104 mmol/L (ref 98–111)
Creatinine, Ser: 0.97 mg/dL (ref 0.61–1.24)
GFR calc Af Amer: >60 mL/min (ref >60)
GFR calc non Af Amer: >60 mL/min (ref >60)
Glucose, Bld: 165 mg/dL — ABNORMAL HIGH (ref 70–99)
Potassium: 3.8 mmol/L (ref 3.5–5.1)
Sodium: 138 mmol/L (ref 135–145)

## 2019-01-18 MED ORDER — METOPROLOL TARTRATE 25 MG PO TABS
25.0000 mg | ORAL_TABLET | Freq: Two times a day (BID) | ORAL | Status: DC
  Administered 2019-01-18 – 2019-01-19 (×2): 25 mg via ORAL
  Filled 2019-01-18 (×2): qty 1

## 2019-01-18 NOTE — Progress Notes (Signed)
Progress Note  Patient Name: Adam Farmer Date of Encounter: 01/18/2019  Primary Cardiologist: new; Dr. Irish Lack  Subjective   No recurrent chest pain; Feels well today  Inpatient Medications    Scheduled Meds: . aspirin EC  81 mg Oral Daily  . atorvastatin  80 mg Oral q1800  . Chlorhexidine Gluconate Cloth  6 each Topical Q0600  . escitalopram  10 mg Oral Daily  . hydrALAZINE  25 mg Oral Q8H  . insulin aspart  0-5 Units Subcutaneous QHS  . insulin aspart  0-9 Units Subcutaneous TID WC  . lisinopril  5 mg Oral Daily  . metoprolol tartrate  12.5 mg Oral BID  . sodium chloride flush  3 mL Intravenous Q12H  . ticagrelor  90 mg Oral BID   Continuous Infusions: . sodium chloride     PRN Meds: sodium chloride, acetaminophen, nitroGLYCERIN, ondansetron (ZOFRAN) IV, sodium chloride flush   Vital Signs    Vitals:   01/18/19 1000 01/18/19 1042 01/18/19 1043 01/18/19 1100  BP: (!) 124/91 (!) 124/91 (!) 124/91 (!) 142/97  Pulse: 77  90 70  Resp: 16   (!) 21  Temp:      TempSrc:      SpO2: 95%   95%  Weight:      Height:        Intake/Output Summary (Last 24 hours) at 01/18/2019 1150 Last data filed at 01/18/2019 1000 Gross per 24 hour  Intake 480 ml  Output 4250 ml  Net -3770 ml    I/O since admission: 29  Filed Weights   01/16/19 2245 01/18/19 0520  Weight: 78.4 kg 75 kg    Telemetry    Sinus at 69 - Personally Reviewed  ECG    01/18/2019 ECG (independently read by me): NSR 83; small Q waves inferiorly  ? Lead V2-V3 reversal;  Will recheck another ECG  01/17/2019 ECG (independently read by me): NSR 64 with resolution of miled STE; early transition V2.  01/16/2019 ECG (independently read by me): NSR 70. Early STE enferioly  Physical Exam    BP (!) 142/97   Pulse 70   Temp 97.8 F (36.6 C) (Oral)   Resp (!) 21   Ht 6' (1.829 m)   Wt 75 kg   SpO2 95%   BMI 22.42 kg/m  General: Alert, oriented, no distress.  Skin: normal turgor, no rashes, warm  and dry HEENT: Normocephalic, atraumatic. Pupils equal round and reactive to light; sclera anicteric; extraocular muscles intact; Nose without nasal septal hypertrophy Mouth/Parynx benign; Mallinpatti scale 3 Neck: No JVD, no carotid bruits; normal carotid upstroke Lungs: decreased BS; no wheezing Chest wall: without tenderness to palpitation Heart: PMI not displaced, RRR, s1 s2 normal, 1/6 systolic murmur, no diastolic murmur, no rubs, gallops, thrills, or heaves Abdomen: soft, nontender; no hepatosplenomehaly, BS+; abdominal aorta nontender and not dilated by palpation. Back: no CVA tenderness Pulses 2+ Musculoskeletal: full range of motion, normal strength, no joint deformities Extremities: no clubbing cyanosis or edema, Homan's sign negative  Neurologic: grossly nonfocal; Cranial nerves grossly wnl Psychologic: Normal mood and affect   Labs    Chemistry Recent Labs  Lab 01/16/19 1943 01/16/19 2042 01/16/19 2045 01/17/19 0635 01/18/19 0334  NA 134* 136  --  137 138  K 3.7 4.1  --  3.7 3.8  CL 99  --   --  105 104  CO2 21*  --   --  22 24  GLUCOSE 196*  --   --  150* 165*  BUN 15  --   --  9 9  CREATININE 1.36*  --  1.00 1.10 0.97  CALCIUM 9.0  --   --  8.9 9.6  GFRNONAA 54*  --   --  >60 >60  GFRAA >60  --   --  >60 >60  ANIONGAP 14  --   --  10 10     Hematology Recent Labs  Lab 01/16/19 1943 01/16/19 2042 01/17/19 0635  WBC 15.5*  --  13.2*  RBC 4.98  --  4.85  HGB 15.4 15.6 15.0  HCT 45.7 46.0 43.4  MCV 91.8  --  89.5  MCH 30.9  --  30.9  MCHC 33.7  --  34.6  RDW 13.5  --  13.6  PLT 201  --  184    Cardiac Enzymes Recent Labs  Lab 01/16/19 1943 01/16/19 2328 01/17/19 0635 01/17/19 1421  TROPONINI <0.03 0.26* 1.87* 5.75*   No results for input(s): TROPIPOC in the last 168 hours.   BNPNo results for input(s): BNP, PROBNP in the last 168 hours.   DDimer No results for input(s): DDIMER in the last 168 hours.   Lipid Panel     Component Value  Date/Time   CHOL 149 01/17/2019 0635   TRIG 180 (H) 01/17/2019 0635   HDL 33 (L) 01/17/2019 0635   CHOLHDL 4.5 01/17/2019 0635   VLDL 36 01/17/2019 0635   LDLCALC 80 01/17/2019 0635   LDLDIRECT 150.5 07/03/2009 1127     Radiology    Dg Chest Portable 1 View  Result Date: 01/16/2019 CLINICAL DATA:  Chest pain EXAM: PORTABLE CHEST 1 VIEW COMPARISON:  10/12/2016 FINDINGS: The heart size is normal. There are multiple old healed left-sided rib fractures. There is a lucency coursing across the patient's right upper thorax which is favored to be secondary to artifact, much less likely pneumothorax. There is no large pleural effusion. No focal area of consolidation. No acute osseous abnormality. IMPRESSION: No active disease. Electronically Signed   By: Constance Holster M.D.   On: 01/16/2019 20:06    Cardiac Studies    Prox Cx to Mid Cx lesion is 25% stenosed.  Dist LM lesion is 20% stenosed.  Ost LAD lesion is 25% stenosed.  Mid LAD-1 lesion is 99% stenosed.  A drug-eluting stent was successfully placed using a STENT SYNERGY DES 3X16.  Post intervention, there is a 0% residual stenosis.  Mid LAD-2 lesion is 75% stenosed, edge dissection.  A drug-eluting stent was successfully placed using a STENT SYNERGY DES 3X8 for the distal edge dissection, overlapping the 16 mm stent.  Post intervention, there is a 0% residual stenosis.  Mid RCA-1 lesion is 90% stenosed.  A drug-eluting stent was successfully placed using a STENT SYNERGY DES 4X28.  Post intervention, there is a 0% residual stenosis.  The left ventricular systolic function is normal.  LV end diastolic pressure is normal.  The left ventricular ejection fraction is 55-65% by visual estimate.  There is no aortic valve stenosis.   Continue aggressive secondary prevention.    Start Metoprolol and high dose atorvastatin.   Watch in ICU.  Findings discussed with the wife.     Intervention     Patient Profile      68 y.o. male with a history of DM, HTN, tobacco abuse who presented 6/8/202 with early STEMI and underwent cath and DES stents to LAD and RCA.    Assessment & Plan    1.  ACS:  s/p DES stent to LAD and RCA. Hopefully there will be good recovery of LV fxn with ECG today fairly normal.  Echo done, results pending. Troponin 1.87. DAPT for a minimum of year.  ECG today ? Lead reversal in V2-V3  With residual STE in V2 and evolutiionary T changes; will repeat.   2. Tobacco abuse > 30 years; discussed cessation.  3. DM: on metforman and amaryl PTA.  Meds on hold post cath.  4. Hyperlidipemia:  Now on atorvastatin 80 mg  5.  Hypertension:  Not on meds PTA.  BP today 158/90;  Will add metoprolol and lisinopril post MI.  HR today in the 80s, will further titrate metoprolol to 25 mg bid.     Cardiac Rehab; transfer to telemetry today and probable DC tomorrow.  Signed, Troy Sine, MD, [facility] 01/18/2019, 11:50 AM

## 2019-01-18 NOTE — Progress Notes (Signed)
CARDIAC REHAB PHASE I   PRE:  Rate/Rhythm: 72 SR  BP:  Supine:   Sitting: 142/97  Standing:    SaO2: 96%RA  MODE:  Ambulation: 370 ft   POST:  Rate/Rhythm: 86 SR  BP:  Supine:   Sitting: 134/94  Standing:    SaO2: 98%RA 1120-1140 Pt walked 370 ft on RA with minimal asst.  A little wobbly at first. Denied CP or dizziness. Reviewed NTG use. Lunch tray came. Set up lunch. Encouraged pt to read ed materials for [place] to discuss at next visit.   Graylon Good, RN BSN  01/18/2019 11:34 AM

## 2019-01-18 NOTE — Progress Notes (Signed)
Second EKG with Abnormal result. Claiborne Billings MD notified. Pt asymptomatic. Will continue to monitor.

## 2019-01-18 NOTE — Progress Notes (Signed)
Abnormal EKG this AM. Results paged to [facility] MD.

## 2019-01-19 ENCOUNTER — Telehealth: Payer: Self-pay | Admitting: Cardiology

## 2019-01-19 ENCOUNTER — Encounter (HOSPITAL_COMMUNITY): Payer: Self-pay | Admitting: Cardiology

## 2019-01-19 DIAGNOSIS — F17200 Nicotine dependence, unspecified, uncomplicated: Secondary | ICD-10-CM

## 2019-01-19 DIAGNOSIS — E782 Mixed hyperlipidemia: Secondary | ICD-10-CM

## 2019-01-19 DIAGNOSIS — E118 Type 2 diabetes mellitus with unspecified complications: Secondary | ICD-10-CM

## 2019-01-19 LAB — BASIC METABOLIC PANEL
Anion gap: 10 (ref 5–15)
BUN: 18 mg/dL (ref 8–23)
CO2: 23 mmol/L (ref 22–32)
Calcium: 9.4 mg/dL (ref 8.9–10.3)
Chloride: 105 mmol/L (ref 98–111)
Creatinine, Ser: 1.12 mg/dL (ref 0.61–1.24)
GFR calc Af Amer: >60 mL/min (ref >60)
GFR calc non Af Amer: >60 mL/min (ref >60)
Glucose, Bld: 143 mg/dL — ABNORMAL HIGH (ref 70–99)
Potassium: 3.9 mmol/L (ref 3.5–5.1)
Sodium: 138 mmol/L (ref 135–145)

## 2019-01-19 LAB — GLUCOSE, CAPILLARY: Glucose-Capillary: 242 mg/dL — ABNORMAL HIGH (ref 70–99)

## 2019-01-19 MED ORDER — LISINOPRIL 5 MG PO TABS: 5.0000 mg | ORAL_TABLET | Freq: Every day | ORAL | 2 refills | Status: DC

## 2019-01-19 MED ORDER — METOPROLOL TARTRATE 25 MG PO TABS: 25.0000 mg | ORAL_TABLET | Freq: Two times a day (BID) | ORAL | 0 refills | Status: DC

## 2019-01-19 MED ORDER — NITROGLYCERIN 0.4 MG SL SUBL: 0.4000 mg | SUBLINGUAL_TABLET | SUBLINGUAL | 2 refills | Status: DC | PRN

## 2019-01-19 MED ORDER — TICAGRELOR 90 MG PO TABS: 90.0000 mg | ORAL_TABLET | Freq: Two times a day (BID) | ORAL | 2 refills | Status: DC

## 2019-01-19 MED ORDER — ASPIRIN 81 MG PO TBEC: 81.0000 mg | DELAYED_RELEASE_TABLET | Freq: Every day | ORAL | 6 refills | Status: DC

## 2019-01-19 MED ORDER — HYDRALAZINE HCL 25 MG PO TABS: 25.0000 mg | ORAL_TABLET | Freq: Three times a day (TID) | ORAL | 1 refills | Status: DC

## 2019-01-19 MED FILL — NITROGLYCERIN 0.4 MG TAB SL: 0.4 | 8 days supply | Qty: 25 | Fill #0

## 2019-01-19 MED FILL — ASPIRIN LOW DOSE 81 MG TBEC: 81 | 30 days supply | Qty: 30 | Fill #0

## 2019-01-19 MED FILL — LISINOPRIL 5 MG TABLET: 5 | 30 days supply | Qty: 30 | Fill #0

## 2019-01-19 MED FILL — METOPROLOL TARTRATE 25 MG T: 25 | 30 days supply | Qty: 60 | Fill #0

## 2019-01-19 MED FILL — hydrALAZINE HCL 25 MG TABS: 25 | 30 days supply | Qty: 90 | Fill #0

## 2019-01-19 MED FILL — BRILINTA 90 MG TABLET: 90 | 30 days supply | Qty: 60 | Fill #0

## 2019-01-19 NOTE — Care Management (Signed)
1144 01-19-19 Brilinta filled via [facility]. CM did provide patient with additional Brilinta discount card. No further needs identified at this time. Bethena Roys, RN,BSN Case Manager [phone]

## 2019-01-19 NOTE — Discharge Instructions (Signed)
Coronary Angiogram With Stent, Care After  This sheet gives you information about how to care for yourself after your procedure. Your health care provider may also give you more specific instructions. If you have problems or questions, contact your health care provider.  What can I expect after the procedure?  After your procedure, it is common to have:   Bruising in the area where a small, thin tube (catheter) was inserted. This usually fades within 1-2 weeks.   Blood collecting in the tissue (hematoma) that may be painful to the touch. It should usually decrease in size and tenderness within 1-2 weeks.  Follow these instructions at home:  Insertion area care   Do not take baths, swim, or use a hot tub until your health care provider approves.   You may shower 24-48 hours after the procedure or as directed by your health care provider.   Follow instructions from your health care provider about how to take care of your incision. Make sure you:  ? Wash your hands with soap and water before you change your bandage (dressing). If soap and water are not available, use hand sanitizer.  ? Change your dressing as told by your health care provider.  ? Leave stitches (sutures), skin glue, or adhesive strips in place. These skin closures may need to stay in place for 2 weeks or longer. If adhesive strip edges start to loosen and curl up, you may trim the loose edges. Do not remove adhesive strips completely unless your health care provider tells you to do that.   Remove the bandage (dressing) and gently wash the catheter insertion site with plain soap and water.   Pat the area dry with a clean towel. Do not rub the area, because that may cause bleeding.   Do not apply powder or lotion to the incision area.   Check your incision area every day for signs of infection. Check for:  ? More redness, swelling, or pain.  ? More fluid or blood.  ? Warmth.  ? Pus or a bad smell.  Activity   Do not drive for 24 hours if you  were given a medicine to help you relax (sedative).   Do not lift anything that is heavier than 10 lb (4.5 kg) for 5 days after your procedure or as directed by your health care provider.   Ask your health care provider when it is okay for you:  ? To return to work or school.  ? To resume usual physical activities or sports.  ? To resume sexual activity.  Eating and drinking     Eat a heart-healthy diet. This should include plenty of fresh fruits and vegetables.   Avoid the following types of food:  ? Food that is high in salt.  ? Canned or highly processed food.  ? Food that is high in saturated fat or sugar.  ? Fried food.   Limit alcohol intake to no more than 1 drink a day for non-pregnant women and 2 drinks a day for men. One drink equals 12 oz of beer, 5 oz of wine, or 1 oz of hard liquor.  Lifestyle     Do not use any products that contain nicotine or tobacco, such as cigarettes and e-cigarettes. If you need help quitting, ask your health care provider.   Take steps to manage and control your weight.   Get regular exercise.   Manage your blood pressure.   Manage other health problems, such as diabetes.    General instructions   Take over-the-counter and prescription medicines only as told by your health care provider. Blood thinners may be prescribed after your procedure to improve blood flow through the stent.   If you need an MRI after your heart stent has been placed, be sure to tell the health care provider who orders the MRI that you have a heart stent.   Keep all follow-up visits as directed by your health care provider. This is important.  Contact a health care provider if:   You have a fever.   You have chills.   You have increased bleeding from the catheter insertion area. Hold pressure on the area.  Get help right away if:   You develop chest pain or shortness of breath.   You feel faint or you pass out.   You have unusual pain at the catheter insertion area.   You have redness,  warmth, or swelling at the catheter insertion area.   You have drainage (other than a small amount of blood on the dressing) from the catheter insertion area.   The catheter insertion area is bleeding, and the bleeding does not stop after 30 minutes of holding steady pressure on the area.   You develop bleeding from any other place, such as from your rectum. There may be bright red blood in your urine or stool, or it may appear as black, tarry stool.  This information is not intended to replace advice given to you by your health care provider. Make sure you discuss any questions you have with your health care provider.  Document Released: 02/13/2005 Document Revised: 04/23/2016 Document Reviewed: 04/23/2016  [employer]vier Interactive Patient Education  2019 [employer]

## 2019-01-19 NOTE — Telephone Encounter (Signed)
**Note De-identified Adam Farmer Obfuscation** The pt is being discharged today. We will call him tomorrow. 

## 2019-01-19 NOTE — Discharge Summary (Addendum)
Discharge Summary    Patient ID: Adam Farmer,  MRN: [id number], DOB/AGE: [birth year] 66 y.o.  Admit date: 01/16/2019 Discharge date: 01/19/2019  Primary Care Provider: Jani Gravel Primary Cardiologist: Dr. Irish Lack  Discharge Diagnoses    Principal Problem:   STEMI (ST elevation myocardial infarction) [facility]) Active Problems:   HYPERLIPIDEMIA, MIXED   TOBACCO USE   Acute anterior wall MI ([place])   Type 2 diabetes mellitus with complication, without long-term current use of insulin (HCC)   Allergies Allergies  Allergen Reactions  . Latex     REACTION: itching    Diagnostic Studies/Procedures    Cath: 01/16/19   Prox Cx to Mid Cx lesion is 25% stenosed.  Dist LM lesion is 20% stenosed.  Ost LAD lesion is 25% stenosed.  Mid LAD-1 lesion is 99% stenosed.  A drug-eluting stent was successfully placed using a STENT SYNERGY DES 3X16.  Post intervention, there is a 0% residual stenosis.  Mid LAD-2 lesion is 75% stenosed, edge dissection.  A drug-eluting stent was successfully placed using a STENT SYNERGY DES 3X8 for the distal edge dissection, overlapping the 16 mm stent.  Post intervention, there is a 0% residual stenosis.  Mid RCA-1 lesion is 90% stenosed.  A drug-eluting stent was successfully placed using a STENT SYNERGY DES 4X28.  Post intervention, there is a 0% residual stenosis.  The left ventricular systolic function is normal.  LV end diastolic pressure is normal.  The left ventricular ejection fraction is 55-65% by visual estimate.  There is no aortic valve stenosis.   Continue aggressive secondary prevention.    Start Metoprolol and high dose atorvastatin.   Watch in ICU.  Findings discussed with the wife.   Diagnostic Dominance: Right  Intervention    TTE: 01/17/19  IMPRESSIONS    1. The left ventricle has normal systolic function, with an ejection fraction of 55-60%. The cavity size was normal. Left ventricular diastolic  parameters were normal. No evidence of left ventricular regional wall motion abnormalities.  2. The right ventricle has normal systolic function. The cavity was normal. There is no increase in right ventricular wall thickness. Right ventricular systolic pressure could not be assessed.  3. The aortic valve is tricuspid. Mild sclerosis of the aortic valve.  4. There is mild dilatation of the aortic root measuring 38 mm.  5. The inferior vena cava was dilated in size with >50% respiratory variability. _____________   History of Present Illness     68 y.o. male with a history of diabetes mellitus, current smoking and hyperlipidemia presents with acute onset of substernal chest pain starting at around 5 pm the evening of admission. The pain started while he was at the driving range. Described as 8/10 in intensity and over the left precordium. No radiation of the pain. He stated that it felt like indigestion. The patient took some ibuprofen and antacids without relief. He also had some tingling in the left arm and a feeling of nausea. No vomiting and no diaphoresis. In the ED he was given 324mg  of ASA. The ECG revealed ST elevations in the inferior and anterior leads. He also received a bolus of IV heparin.  COVID screen: No fevers or chills. No recent travel or sick contacts.  He was brought directly to the cath lab for emergent cardiac cath.   Hospital Course     Underwent cardiac cath noted above with PCI/DES to the LAD and RCA. Placed on DAPT with ASA/Brilinta post cath with plans  for one year. Troponin peaked at 5.75. Placed on high dose statin, LDL 80. Added low dose metoprolol and Lisinopril with blood pressures tolerating. Hgb A1c was 7.5, noted to be on metformin prior to cath, though held during admission. His metoprolol was further titrated to 25mg  BID. Serial EKG showed evolutionary changes throughout admission. He was transferred from the ICU on 01/18/19. Worked well with cardiac rehab  without recurrent chest pain.   General: Well developed, well nourished, male appearing in no acute distress. Head: Normocephalic, atraumatic.  Neck: Supple without bruits. Lungs:  Resp regular and unlabored, CTA. Heart: RRR, S1, S2, no S3, S4, or murmur; no rub. Abdomen: Soft, non-tender, non-distended with normoactive bowel sounds. No hepatomegaly. No rebound/guarding. No obvious abdominal masses. Extremities: No clubbing, cyanosis, edema. Distal pedal pulses are 2+ bilaterally. Right radial cath site stable without bruising or hematoma Neuro: Alert and oriented X 3. Moves all extremities spontaneously. Psych: Normal affect.  DEVUN ANNA was seen by Dr. Claiborne Billings and determined stable for discharge home. Follow up in the office has been arranged. Medications are listed below.   _____________  Discharge Vitals Blood pressure 114/67, pulse 75, temperature 98.8 F (37.1 C), temperature source Oral, resp. rate 18, height 5' 11.26" (1.81 m), weight 75.1 kg, SpO2 95 %.  Filed Weights   01/18/19 0520 01/18/19 1700 01/19/19 0613  Weight: 75 kg 75 kg 75.1 kg    Labs & Radiologic Studies    CBC Recent Labs    01/16/19 1943 01/16/19 2042 01/17/19 0635  WBC 15.5*  --  13.2*  HGB 15.4 15.6 15.0  HCT 45.7 46.0 43.4  MCV 91.8  --  89.5  PLT 201  --  734   Basic Metabolic Panel Recent Labs    01/18/19 0334 01/19/19 0350  NA 138 138  K 3.8 3.9  CL 104 105  CO2 24 23  GLUCOSE 165* 143*  BUN 9 18  CREATININE 0.97 1.12  CALCIUM 9.6 9.4   Liver Function Tests No results for input(s): AST, ALT, ALKPHOS, BILITOT, PROT, ALBUMIN in the last 72 hours. No results for input(s): LIPASE, AMYLASE in the last 72 hours. Cardiac Enzymes Recent Labs    01/16/19 2328 01/17/19 0635 01/17/19 1421  TROPONINI 0.26* 1.87* 5.75*   BNP Invalid input(s): POCBNP D-Dimer No results for input(s): DDIMER in the last 72 hours. Hemoglobin A1C Recent Labs    01/17/19 0635  HGBA1C 7.5*    Fasting Lipid Panel Recent Labs    01/17/19 0635  CHOL 149  HDL 33*  LDLCALC 80  TRIG 180*  CHOLHDL 4.5   Thyroid Function Tests No results for input(s): TSH, T4TOTAL, T3FREE, THYROIDAB in the last 72 hours.  Invalid input(s): FREET3 _____________  Dg Chest Portable 1 View  Result Date: 01/16/2019 CLINICAL DATA:  Chest pain EXAM: PORTABLE CHEST 1 VIEW COMPARISON:  10/12/2016 FINDINGS: The heart size is normal. There are multiple old healed left-sided rib fractures. There is a lucency coursing across the patient's right upper thorax which is favored to be secondary to artifact, much less likely pneumothorax. There is no large pleural effusion. No focal area of consolidation. No acute osseous abnormality. IMPRESSION: No active disease. Electronically Signed   By: Constance Holster M.D.   On: 01/16/2019 20:06   Disposition   Pt is being discharged home today in good condition.  Follow-up Plans & Appointments    Follow-up Information    Consuelo Pandy, PA-C Follow up on 01/31/2019.   Specialties: Cardiology,  Radiology Why: at 9:15am for you follow up appt. This will be a virtual visit through your mobile phone.  Contact information: [place] [id number] [phone]          Discharge Instructions    Amb Referral to Cardiac Rehabilitation   Complete by: As directed    Diagnosis:  Coronary Stents STEMI     After initial evaluation and assessments completed: Virtual Based Care may be provided alone or in conjunction with Phase 2 Cardiac Rehab based on patient barriers.: Yes       Discharge Medications     Medication List    STOP taking these medications   atorvastatin 10 MG tablet Commonly known as: LIPITOR   ibuprofen 200 MG tablet Commonly known as: ADVIL     TAKE these medications   aspirin 81 MG EC tablet Take 1 tablet (81 mg total) by mouth daily.   cholecalciferol 25 MCG (1000 UT) tablet Commonly known as: VITAMIN D3  Take 1,000 Units by mouth daily.   escitalopram 10 MG tablet Commonly known as: LEXAPRO Take 10 mg by mouth daily.   glimepiride 1 MG tablet Commonly known as: AMARYL Take 1 mg by mouth daily.   hydrALAZINE 25 MG tablet Commonly known as: APRESOLINE Take 1 tablet (25 mg total) by mouth every 8 (eight) hours.   lisinopril 5 MG tablet Commonly known as: ZESTRIL Take 1 tablet (5 mg total) by mouth daily.   metFORMIN 500 MG tablet Commonly known as: GLUCOPHAGE Take 500 mg by mouth 2 (two) times daily with a meal.   metoprolol tartrate 25 MG tablet Commonly known as: LOPRESSOR Take 1 tablet (25 mg total) by mouth 2 (two) times daily.   nitroGLYCERIN 0.4 MG SL tablet Commonly known as: NITROSTAT Place 1 tablet (0.4 mg total) under the tongue every 5 (five) minutes x 3 doses as needed for chest pain.   ticagrelor 90 MG Tabs tablet Commonly known as: BRILINTA Take 1 tablet (90 mg total) by mouth 2 (two) times daily.   VITAMIN A PO Take 1 tablet by mouth daily.   vitamin C 100 MG tablet Take 100 mg by mouth daily.   vitamin E 100 UNIT capsule Take 100 Units by mouth daily.       Acute coronary syndrome (MI, NSTEMI, STEMI, etc) this admission?: Yes.     AHA/ACC Clinical Performance & Quality Measures: 1. Aspirin prescribed? - Yes 2. ADP Receptor Inhibitor (Plavix/Clopidogrel, Brilinta/Ticagrelor or Effient/Prasugrel) prescribed (includes medically managed patients)? - Yes 3. Beta Blocker prescribed? - Yes 4. High Intensity Statin (Lipitor 40-80mg  or Crestor 20-40mg ) prescribed? - Yes 5. EF assessed during THIS hospitalization? - Yes 6. For EF <40%, was ACEI/ARB prescribed? - Yes 7. For EF <40%, Aldosterone Antagonist (Spironolactone or Eplerenone) prescribed? - Not Applicable (EF >/= 00%) 8. Cardiac Rehab Phase II ordered (Included Medically managed Patients)? - Yes      Outstanding Labs/Studies   FLP/LFTs in 6 weeks.   Duration of Discharge Encounter   Greater  than 30 minutes including physician time.  Signed, Reino Bellis NP-C 01/19/2019, 9:16 AM    Patient seen and examined. Agree with assessment and plan. Doing well, ambulating without chest pain,. Now on metoprolol 25 mg bid, lisinopril and hydralazine.  I reviewed cath finding, smoking cessation and discussed a [place] diet.  OK for DC today; f/u Dr. Irish Lack.   Troy Sine, MD, [facility] 01/19/2019 9:42 AM  Addendum: Vita Barley that Rx for Atorvastatin 80mg   daily did not send at discharge. I have re-sent to his pharmacy and called him informing the need to pick and start.   Signed, Reino Bellis, NP-C 01/23/2019, 3:26 PM

## 2019-01-19 NOTE — Telephone Encounter (Signed)
New message   Per Tobin Chad set up a [facility] Appointment with Lyda Jester on 01/31/2019 at 9:15 am.

## 2019-01-19 NOTE — Progress Notes (Signed)
CARDIAC REHAB PHASE I   PRE:  Rate/Rhythm: 81 SR  BP:  Supine:   Sitting: 114/67  Standing:    SaO2: 95%RA  MODE:  Ambulation: 700 ft   POST:  Rate/Rhythm: 93 SR  BP:  Supine:   Sitting: 120/69  Standing:    SaO2: 98%RA [id number] Pt walked 700 ft on RA with steady gait and no CP and tolerated well. Reviewed heart healthy food choices. Reviewed NTG and brilinta use. Pt hoping to go home.   Graylon Good, RN BSN  01/19/2019 9:31 AM

## 2019-01-20 NOTE — Telephone Encounter (Signed)
**Note De-Identified Adam Farmer Obfuscation** Patient contacted regarding discharge from [facility] on 01/19/2019.  Patient understands to follow up with provider Ellen Henri, PA-c on 01/31/2019 at 9:15 for a video visit/hosp f/u. Patient understands discharge instructions? Yes Patient understands medications and regiment? Yes Patient understands to bring all medications to this visit? Yes

## 2019-01-23 ENCOUNTER — Other Ambulatory Visit: Payer: Self-pay | Admitting: Cardiology

## 2019-01-23 MED ORDER — ATORVASTATIN CALCIUM 80 MG PO TABS: 80.0000 mg | ORAL_TABLET | Freq: Every day | ORAL | 1 refills | Status: DC

## 2019-01-25 ENCOUNTER — Telehealth: Payer: Self-pay

## 2019-01-25 NOTE — Telephone Encounter (Signed)
lpmtcb regarding appointment on 6/23.

## 2019-01-25 NOTE — Telephone Encounter (Signed)
   TELEPHONE CALL NOTE  This patient has been deemed a candidate for follow-up tele-health visit to limit community exposure during the Covid-19 pandemic. I spoke with the patient via phone to discuss instructions.  A Virtual Office Visit appointment type has been scheduled for 01/31/19 with Lyda Jester, PA, with "VIDEO" or "TELEPHONE" in the appointment notes - patient prefers Video type.  Frederik Schmidt, RN 01/25/2019 12:40 PM  Patient has consented to a Virtual Visit by Video on 6/23 with Lyda Jester, PA.

## 2019-01-26 ENCOUNTER — Telehealth: Payer: Self-pay | Admitting: Cardiology

## 2019-01-26 MED ORDER — DIPHENHYDRAMINE HCL 25 MG PO TABS: ORAL_TABLET | ORAL | 0 refills | Status: DC

## 2019-01-26 MED ORDER — CETIRIZINE HCL 5 MG PO CHEW: 5.0000 mg | CHEWABLE_TABLET | Freq: Every day | ORAL | Status: DC

## 2019-01-26 NOTE — Telephone Encounter (Signed)
New Message  Patient's Wife Otilio Miu) called in to advise that patient has a raised red rash all over (non-itchy) and is concerned that patient is having a reaction to the medications or side effect of stint.      Do not have access to .dot phrases

## 2019-01-26 NOTE — Telephone Encounter (Signed)
Any chance we could find out who the pharmacist on site is? Given that the patient just had a STEMI with stent placement and lots of new medications, I do not feel completely comfortable adjusting these without the recommendations of JV and/or pharmacy.   Thank you  Kathyrn Drown NP-C [facility] Pager: [phone]

## 2019-01-26 NOTE — Telephone Encounter (Signed)
Called patient's wife back (DPR). Patient had cath and stents placed last Monday (01/16/19). Patient started on new meicatons while in the hospital and was sent home on Hydralazine, Brilinta, Metoprolol, Lisinopril and ASA. Patient has been out of the hospital for a week, and woke-up with a rash on his chest, abdomen, back, arms, and thighs. The rash is red and raised, but he has no itching. Patient is concerned about taking his medications and does not want to take it until he hears back from the provider. Patient is going to take Benadryl to see if that helps with the rash. Patient has no other symptoms at this time. Will send message to PA and Dr. Irish Lack for further advisement.

## 2019-01-26 NOTE — Telephone Encounter (Signed)
Called patient's wife back with Sharee Pimple McDaniel's recommendations. Patient will discontinue hydralazine. Will continue Brilinta, ASA, and metoprolol. Patient will take Benadryl 25 mg to 12.5 mg by mouth 4 to 6 hours as needed for rash and cetirizine 5 mg by mouth daily to help with allergy. Patient will call our office if symptoms do not improve or get worse. Patient will keep f/u appt on 01/31/19. Patient's wife verbalized understanding and will call with any other concerns or questions.

## 2019-01-26 NOTE — Telephone Encounter (Signed)
Discussed symptoms with pharmacy for possible allergy culprit.  Unfortunately, given his recent STEMI, Brilinta, aspirin and beta-blocker should remain for now.  We will will attempt to discontinue hydralazine.  If continues to have reaction with the elimination, at that point we could address these.  Would strive to continue Brilinta at least for 1 month, then if needed and okay with cardiologist, could transition to Plavix.  For now we will treat rash with Benadryl 25 mg p.o. every 4 to 6 hours until rash subsides.  Would also recommend taking cetirizine 5 mg p.o. daily during that time.  If unable to tolerate a 25 mg Benadryl tablet, could reduce to 12.5 mg to decrease the sedative effect.  We will start with eliminating hydralazine from his regimen.  BP on hospital discharge 01/19/2019 was 114/67.  May need further adjustments with BP medications based on response.  Has hospital follow-up visit on 01/31/2019.  If symptoms do not improve closer to this time or they worsen please call the office and let us know.   Kathyrn Drown NP-C [facility] Pager: [phone]

## 2019-01-30 NOTE — Telephone Encounter (Signed)
OK 

## 2019-01-31 ENCOUNTER — Telehealth (INDEPENDENT_AMBULATORY_CARE_PROVIDER_SITE_OTHER): Payer: BC Managed Care – PPO | Admitting: Cardiology

## 2019-01-31 ENCOUNTER — Other Ambulatory Visit: Payer: Self-pay

## 2019-01-31 ENCOUNTER — Telehealth (HOSPITAL_COMMUNITY): Payer: Self-pay

## 2019-01-31 ENCOUNTER — Encounter: Payer: Self-pay | Admitting: Cardiology

## 2019-01-31 VITALS — Ht 71.5 in | Wt 165.0 lb

## 2019-01-31 DIAGNOSIS — I2510 Atherosclerotic heart disease of native coronary artery without angina pectoris: Secondary | ICD-10-CM

## 2019-01-31 DIAGNOSIS — E785 Hyperlipidemia, unspecified: Secondary | ICD-10-CM

## 2019-01-31 DIAGNOSIS — Z9861 Coronary angioplasty status: Secondary | ICD-10-CM | POA: Diagnosis not present

## 2019-01-31 NOTE — Progress Notes (Signed)
Virtual Visit via Video Note   This visit type was conducted due to national recommendations for restrictions regarding the COVID-19 Pandemic (e.g. social distancing) in an effort to limit this patient's exposure and mitigate transmission in our community.  Due to his co-morbid illnesses, this patient is at least at moderate risk for complications without adequate follow up.  This format is felt to be most appropriate for this patient at this time.  All issues noted in this document were discussed and addressed.  A limited physical exam was performed with this format.  Please refer to the patient's chart for his consent to telehealth for [facility]   Date:  01/31/2019   ID:  Adam Farmer, DOB Jul 13, 1951, MRN [id number]  Patient Location: Home Provider Location: Office  PCP:  Jani Gravel, MD  Cardiologist:  Dr. Christ Kick Electrophysiologist:  None   Evaluation Performed:  Follow-Up Visit  Chief Complaint:  [facility] F/u CAD s/p STEMI  History of Present Illness:    Adam Farmer is a 68 y.o. male with h/o DM, tobacco abuse and HLD who recently presented to [facility] w/ SCCP and EKG c/w acute STEMI. Emergent cath on 01/16/19 showed high grade stenosis in the mid LAD (99%) and mid RCA (90%). Both lesions were successfully treated w/ PCI + DES. EF normal by echo. Troponin peaked at 5.75. Lipid panel showed elevated LDL at 80 mg/dL. Hgb A1c was 7.5.  He was placed on DAPT w/ ASA and Brilinta, high intensity statin therapy w/ atorvastatin 80 mg and continued on  blocker (metoprolol) and ACEi (lisinoprill).  Today in follow-up, he reports that he has done well.  He denies any recurrent anginal symptomatology.  No required use of sublingual nitroglycerin.  He reports full compliance with medications.  No abnormal bleeding with dual antiplatelet therapy.  Tolerating high-dose statin therapy without side effects.  Unfortunately he does not have a home blood pressure cuff at home thus no BP to  report.  His radial cath site is stable.  Denies any pain.  He has been walking around his house for exercise.  No exertional symptoms.  Luckily he has been able to quit smoking since his hospitalization.   The patient does not have symptoms concerning for COVID-19 infection (fever, chills, cough, or new shortness of breath).    Past Medical History:  Diagnosis Date  . ABNORMAL GLUCOSE NEC 05/03/2007  . ANXIETY 05/03/2007  . COLONIC POLYPS, HX OF 07/09/2009  . HYPERLIPIDEMIA, MIXED 05/03/2007  . STEMI (ST elevation myocardial infarction) ([place])    01/16/19 PCI to LAD, RCA  . TOBACCO USE 05/03/2007   Past Surgical History:  Procedure Laterality Date  . CORONARY STENT INTERVENTION N/A 01/16/2019   Procedure: CORONARY STENT INTERVENTION;  Surgeon: Jettie Booze, MD;  Location: [place] [facility];  Service: Cardiovascular;  Laterality: N/A;  . CORONARY/GRAFT ACUTE MI REVASCULARIZATION N/A 01/16/2019   Procedure: Coronary/Graft Acute MI Revascularization;  Surgeon: Jettie Booze, MD;  Location: [place] [facility];  Service: Cardiovascular;  Laterality: N/A;  . Fractured elbow, nose and toe    . LEFT HEART CATH AND CORONARY ANGIOGRAPHY N/A 01/16/2019   Procedure: LEFT HEART CATH AND CORONARY ANGIOGRAPHY;  Surgeon: Jettie Booze, MD;  Location: [place] [facility];  Service: Cardiovascular;  Laterality: N/A;  . WISDOM TOOTH EXTRACTION       Current Meds  Medication Sig  . Ascorbic Acid (VITAMIN C) 100 MG tablet Take 100 mg by mouth daily.  Marland Kitchen  aspirin EC 81 MG EC tablet Take 1 tablet (81 mg total) by mouth daily.  Marland Kitchen atorvastatin (LIPITOR) 80 MG tablet Take 1 tablet (80 mg total) by mouth daily.  . cetirizine (ZYRTEC) 5 MG chewable tablet Chew 1 tablet (5 mg total) by mouth daily.  . cholecalciferol (VITAMIN D3) 25 MCG (1000 UT) tablet Take 1,000 Units by mouth daily.  . diphenhydrAMINE (BENADRYL ALLERGY) 25 MG tablet Take 25 mg ( 1 tablet) or 12.5 mg ( 1/2 tablet) every 4 to 6 hours  as needed for rash  . escitalopram (LEXAPRO) 10 MG tablet Take 10 mg by mouth daily.  Marland Kitchen lisinopril (ZESTRIL) 5 MG tablet Take 1 tablet (5 mg total) by mouth daily.  . metFORMIN (GLUCOPHAGE) 500 MG tablet Take 500 mg by mouth 2 (two) times daily with a meal.    . metoprolol tartrate (LOPRESSOR) 25 MG tablet Take 1 tablet (25 mg total) by mouth 2 (two) times daily.  . nitroGLYCERIN (NITROSTAT) 0.4 MG SL tablet Place 1 tablet (0.4 mg total) under the tongue every 5 (five) minutes x 3 doses as needed for chest pain.  . ticagrelor (BRILINTA) 90 MG TABS tablet Take 1 tablet (90 mg total) by mouth 2 (two) times daily.  Marland Kitchen VITAMIN A PO Take 1 tablet by mouth daily.  . vitamin E 100 UNIT capsule Take 100 Units by mouth daily.     Allergies:   Latex and Hydralazine   Social History   Tobacco Use  . Smoking status: Current Every Day Smoker    Packs/day: 1.00    Years: 26.00    Pack years: 26.00    Types: Cigarettes  . Smokeless tobacco: Never Used  . Tobacco comment: discussed strategy  Substance Use Topics  . Alcohol use: Yes    Alcohol/week: 1.0 - 2.0 standard drinks    Types: 1 - 2 Standard drinks or equivalent per week  . Drug use: No     Family Hx: The patient's family history includes Colon polyps in his father; Diabetes in his father and paternal grandmother; Hyperlipidemia in his mother; Lung cancer in his father. There is no history of Cancer or Heart disease.  ROS:   Please see the history of present illness.     All other systems reviewed and are negative.   Prior CV studies:   The following studies were reviewed today:  Cath: 01/16/19   Prox Cx to Mid Cx lesion is 25% stenosed.  Dist LM lesion is 20% stenosed.  Ost LAD lesion is 25% stenosed.  Mid LAD-1 lesion is 99% stenosed.  A drug-eluting stent was successfully placed using a STENT SYNERGY DES 3X16.  Post intervention, there is a 0% residual stenosis.  Mid LAD-2 lesion is 75% stenosed, edge dissection.  A  drug-eluting stent was successfully placed using a STENT SYNERGY DES 3X8 for the distal edge dissection, overlapping the 16 mm stent.  Post intervention, there is a 0% residual stenosis.  Mid RCA-1 lesion is 90% stenosed.  A drug-eluting stent was successfully placed using a STENT SYNERGY DES 4X28.  Post intervention, there is a 0% residual stenosis.  The left ventricular systolic function is normal.  LV end diastolic pressure is normal.  The left ventricular ejection fraction is 55-65% by visual estimate.  There is no aortic valve stenosis.  Continue aggressive secondary prevention.   Start Metoprolol and high dose atorvastatin.   Watch in ICU. Findings discussed with the wife.   Diagnostic Dominance: Right  Intervention  TTE: 01/17/19  IMPRESSIONS   1. The left ventricle has normal systolic function, with an ejection fraction of 55-60%. The cavity size was normal. Left ventricular diastolic parameters were normal. No evidence of left ventricular regional wall motion abnormalities. 2. The right ventricle has normal systolic function. The cavity was normal. There is no increase in right ventricular wall thickness. Right ventricular systolic pressure could not be assessed. 3. The aortic valve is tricuspid. Mild sclerosis of the aortic valve. 4. There is mild dilatation of the aortic root measuring 38 mm. 5. The inferior vena cava was dilated in size with >50% respiratory variability  Labs/Other Tests and Data Reviewed:    EKG:  No ECG reviewed.  Recent Labs: 01/17/2019: Hemoglobin 15.0; Platelets 184 01/19/2019: BUN 18; Creatinine, Ser 1.12; Potassium 3.9; Sodium 138   Recent Lipid Panel Lab Results  Component Value Date/Time   CHOL 149 01/17/2019 06:35 AM   TRIG 180 (H) 01/17/2019 06:35 AM   HDL 33 (L) 01/17/2019 06:35 AM   CHOLHDL 4.5 01/17/2019 06:35 AM   LDLCALC 80 01/17/2019 06:35 AM   LDLDIRECT 150.5 07/03/2009 11:27 AM    Wt Readings  from Last 3 Encounters:  01/31/19 165 lb (74.8 kg)  01/19/19 165 lb 9.1 oz (75.1 kg)  12/11/16 186 lb (84.4 kg)     Objective:    Vital Signs:  Ht 5' 11.5" (1.816 m)   Wt 165 lb (74.8 kg)   BMI 22.69 kg/m    VITAL SIGNS:  reviewed GEN:  no acute distress EYES:  sclerae anicteric, EOMI - Extraocular Movements Intact RESPIRATORY:  normal respiratory effort, symmetric expansion CARDIOVASCULAR:  no peripheral edema MUSCULOSKELETAL:  no obvious deformities. NEURO:  alert and oriented x 3, no obvious focal deficit PSYCH:  normal affect  ASSESSMENT & PLAN:    1. CAD status post STEMI: Emergent cardiac catheterization 01/16/2019 showed severe two-vessel CAD with 99% occluded mid LAD and 90% mid RCA, both successfully treated with PCI utilizing drug-eluting stents.  EF normal by echo.  He is stable without anginal symptomatology.  Continue dual antiplatelet therapy with aspirin and Brilinta for minimum of 1 year along with high intensity statin, beta-blocker and ACE inhibitor.  2.  Hyperlipidemia: Recent fasting lipid panel showed elevated LDL at 80 mg/dL.  His LDL goal in the setting of known CAD and diabetes is less than 70 mg/dL.  He has been started on high intensity statin therapy with atorvastatin 80 mg and seems to be tolerating this dose okay without any side effects.  We will plan to recheck a fasting lipid panel and hepatic function test again in 8 weeks.  If not at goal, consider addition of Zetia.  3.  Tobacco abuse: Patient was an active smoker at time of his myocardial infarction however he reports that he has quit since his hospital limitation.  He was congratulated on his efforts and was encouraged to continue to refrain from further use.  4.  Diabetes: Followed by PCP.  On metformin.  Recent hemoglobin A1c was 7.5.  He is on an ACE inhibitor for renal protection.   COVID-19 Education: The signs and symptoms of COVID-19 were discussed with the patient and how to seek care for  testing (follow up with PCP or arrange E-visit). The importance of social distancing was discussed today.  Time:   Today, I have spent 25 minutes with the patient with telehealth technology discussing the above problems.     Medication Adjustments/Labs and Tests Ordered: Current medicines are  reviewed at length with the patient today.  Concerns regarding medicines are outlined above.   Tests Ordered: Orders Placed This Encounter  Procedures  . Lipid Profile  . Hepatic function panel    Medication Changes: No orders of the defined types were placed in this encounter.   Follow Up:  In Person in 8 week(s) w/ Dr. Irish Lack.   Signed, Lyda Jester, PA-C  01/31/2019 11:03 AM    [place]

## 2019-01-31 NOTE — Patient Instructions (Signed)
Medication Instructions:  none If you need a refill on your cardiac medications before your next appointment, please call your pharmacy.   Lab work:PLEASE SCHEDULE 8 WEEKS, someone will call you FLP HFT If you have labs (blood work) drawn today and your tests are completely normal, you will receive your results only by: Marland Kitchen MyChart Message (if you have MyChart) OR . A paper copy in the mail If you have any lab test that is abnormal or we need to change your treatment, we will call you to review the results.  Testing/Procedures: NONE  Follow-Up: Dr Irish Lack in 8 weeks (same day as labs)  Someone will call you At [facility], you and your health needs are our priority.  As part of our continuing mission to provide you with exceptional heart care, we have created designated Provider Care Teams.  These Care Teams include your primary Cardiologist (physician) and Advanced Practice Providers (APPs -  Physician Assistants and Nurse Practitioners) who all work together to provide you with the care you need, when you need it. .   Any Other Special Instructions Will Be Listed Below (If Applicable). You may drive and play golf, but no more than 25 lbs lifting.

## 2019-01-31 NOTE — Telephone Encounter (Signed)

## 2019-02-01 ENCOUNTER — Encounter (HOSPITAL_COMMUNITY)
Admission: RE | Admit: 2019-02-01 | Discharge: 2019-02-01 | Disposition: A | Discharge status: Home / Self Care | Payer: Self-pay | Source: Ambulatory Visit | Attending: Interventional Cardiology | Admitting: Interventional Cardiology

## 2019-02-01 NOTE — Progress Notes (Signed)
Called and spoke to pt regarding Virtual Cardiac Rehab.  Pt  was able to download the [employer] app on their smart device with no issues. Pt set up their account and received the following welcome message -"Welcome to the [facility] and Pulmonary Rehabilitation program. We hope that you will find the exercise program beneficial in your recovery process. Our staff is available to assist with any questions/concerns about your exercise routine. Best wishes". Brief orientation provided to with the advisement to watch the "Intro to Rehab" series located under the Resource tab. Pt verbalized understanding. Will continue to follow and monitor pt progress with feedback as needed.

## 2019-02-14 ENCOUNTER — Telehealth: Payer: Self-pay | Admitting: Interventional Cardiology

## 2019-02-14 NOTE — Telephone Encounter (Signed)
New Message     Patient states he's looking for his paper work that was faxed in from his [employer] and he wants to know if it was filled out so he can get his medication paid for.  Patient states the fax number is [phone] to send completed paper work in.

## 2019-02-17 NOTE — Telephone Encounter (Signed)
  Wife is calling back to check on the paperwork for his disability from [employer]. She states they have called a few times and no one is getting back to them to give them an update. She would really like to hear from someone today please.

## 2019-02-17 NOTE — Telephone Encounter (Signed)
New Message    Patient calling for disability papers to be filled out. Please call patient back.

## 2019-02-20 NOTE — Telephone Encounter (Signed)
Left message for patient to call back.   We have not received any disability paperwork. Per Medical Records, it was sent to the patient.

## 2019-02-20 NOTE — Telephone Encounter (Signed)
Left a message for patient to call the office. See telephone encounter 02/14/19

## 2019-02-21 ENCOUNTER — Telehealth: Payer: Self-pay | Admitting: Interventional Cardiology

## 2019-02-21 NOTE — Telephone Encounter (Signed)
FMLA form placed in Dr. Hassell Done box for review. 02/21/19 vlm

## 2019-02-27 ENCOUNTER — Telehealth: Payer: Self-pay | Admitting: Interventional Cardiology

## 2019-02-27 ENCOUNTER — Telehealth (HOSPITAL_COMMUNITY): Payer: Self-pay | Admitting: *Deleted

## 2019-02-27 NOTE — Telephone Encounter (Signed)
Patient made aware.

## 2019-02-27 NOTE — Telephone Encounter (Signed)
Signed FMLA form returned to [place] at [place]. 02/27/19 vlm

## 2019-02-27 NOTE — Telephone Encounter (Signed)
Left message for patient to call back.  Disability forms were in Dr. Hassell Done box this morning. Dr. Irish Lack has completed these forms and they have been given back to Medical Records.

## 2019-02-27 NOTE — Telephone Encounter (Signed)
Pt inactive in [employer] App.  Letter mailed requesting patient to return call regarding this by 03/03/2019. If no response, will discharge from cardiac rehab program.  

## 2019-02-27 NOTE — Telephone Encounter (Signed)
Called and spoke to patient. Patient made aware forms have been filled out and sent back to [facility].

## 2019-02-27 NOTE — Telephone Encounter (Signed)
Follow up   Patient calling back in, returning the call via the voicemail left for him. Please give patient a call back.

## 2019-02-28 ENCOUNTER — Telehealth (HOSPITAL_COMMUNITY): Payer: Self-pay | Admitting: Internal Medicine

## 2019-03-07 ENCOUNTER — Other Ambulatory Visit: Payer: Self-pay | Admitting: Cardiology

## 2019-03-28 NOTE — Progress Notes (Signed)
Virtual Visit via Video Note   This visit type was conducted due to national recommendations for restrictions regarding the COVID-19 Pandemic (e.g. social distancing) in an effort to limit this patient's exposure and mitigate transmission in our community.  Due to his co-morbid illnesses, this patient is at least at moderate risk for complications without adequate follow up.  This format is felt to be most appropriate for this patient at this time.  All issues noted in this document were discussed and addressed.  A limited physical exam was performed with this format.  Please refer to the patient's chart for his consent to telehealth for [facility].   Date:  03/29/2019   ID:  Adam Farmer, DOB [birth year], MRN [id number]  Patient Location: Home Provider Location: Home  PCP:  Jani Gravel, MD  Cardiologist:  No primary care provider on file. [place] Electrophysiologist:  None   Evaluation Performed:  Follow-Up Visit  Chief Complaint:  CAD  History of Present Illness:    Adam Farmer is a 68 y.o. male with CAD who had a cath in 01/2019 when he presented with inferior STEMI.  He had PCI of RCA and LAD at that time.   Cath showed:  "Prox Cx to Mid Cx lesion is 25% stenosed.  Dist LM lesion is 20% stenosed.  Ost LAD lesion is 25% stenosed.  Mid LAD-1 lesion is 99% stenosed.  A drug-eluting stent was successfully placed using a STENT SYNERGY DES 3X16.  Post intervention, there is a 0% residual stenosis.  Mid LAD-2 lesion is 75% stenosed, edge dissection.  A drug-eluting stent was successfully placed using a STENT SYNERGY DES 3X8 for the distal edge dissection, overlapping the 16 mm stent.  Post intervention, there is a 0% residual stenosis.  Mid RCA-1 lesion is 90% stenosed.  A drug-eluting stent was successfully placed using a STENT SYNERGY DES 4X28.  Post intervention, there is a 0% residual stenosis.  The left ventricular systolic function is normal.  LV end  diastolic pressure is normal.  The left ventricular ejection fraction is 55-65% by visual estimate.  There is no aortic valve stenosis.   Continue aggressive secondary prevention.    Start Metoprolol and high dose atorvastatin."  He had a rash after leaving the hospital and meds were changed.   Rash resolved when hydralazine was stopped.   BP has been checked at home on occasion.  409W systolic usually.  He has resumed smoking 3 weeks post MI.    Denies : Chest pain. Dizziness. Leg edema. Nitroglycerin use. Orthopnea. Palpitations. Paroxysmal nocturnal dyspnea. Shortness of breath. Syncope.   He walks a few miles a day, and mows his yard without difficulty.    The patient does not have symptoms concerning for COVID-19 infection (fever, chills, cough, or new shortness of breath).    Past Medical History:  Diagnosis Date  . ABNORMAL GLUCOSE NEC 05/03/2007  . ANXIETY 05/03/2007  . COLONIC POLYPS, HX OF 07/09/2009  . HYPERLIPIDEMIA, MIXED 05/03/2007  . STEMI (ST elevation myocardial infarction) ([place])    01/16/19 PCI to LAD, RCA  . TOBACCO USE 05/03/2007   Past Surgical History:  Procedure Laterality Date  . CORONARY STENT INTERVENTION N/A 01/16/2019   Procedure: CORONARY STENT INTERVENTION;  Surgeon: Jettie Booze, MD;  Location: [place] [facility];  Service: Cardiovascular;  Laterality: N/A;  . CORONARY/GRAFT ACUTE MI REVASCULARIZATION N/A 01/16/2019   Procedure: Coronary/Graft Acute MI Revascularization;  Surgeon: Jettie Booze, MD;  Location: [place]  [facility];  Service: Cardiovascular;  Laterality: N/A;  . Fractured elbow, nose and toe    . LEFT HEART CATH AND CORONARY ANGIOGRAPHY N/A 01/16/2019   Procedure: LEFT HEART CATH AND CORONARY ANGIOGRAPHY;  Surgeon: Jettie Booze, MD;  Location: [place] [facility];  Service: Cardiovascular;  Laterality: N/A;  . WISDOM TOOTH EXTRACTION       No outpatient medications have been marked as taking for the 03/29/19 encounter  (Telemedicine) with Jettie Booze, MD.     Allergies:   Latex and Hydralazine   Social History   Tobacco Use  . Smoking status: Current Every Day Smoker    Packs/day: 1.00    Years: 26.00    Pack years: 26.00    Types: Cigarettes  . Smokeless tobacco: Never Used  . Tobacco comment: discussed strategy  Substance Use Topics  . Alcohol use: Yes    Alcohol/week: 1.0 - 2.0 standard drinks    Types: 1 - 2 Standard drinks or equivalent per week  . Drug use: No     Family Hx: The patient's family history includes Colon polyps in his father; Diabetes in his father and paternal grandmother; Hyperlipidemia in his mother; Lung cancer in his father. There is no history of Cancer or Heart disease.  ROS:   Please see the history of present illness.    Resumed smoking All other systems reviewed and are negative.   Prior CV studies:   The following studies were reviewed today:  Cath results as noted above  Labs/Other Tests and Data Reviewed:    EKG:  An ECG dated 01/19/19 was personally reviewed today and demonstrated:  NSR, mild residual anterior ST elevation  Recent Labs: 01/17/2019: Hemoglobin 15.0; Platelets 184 01/19/2019: BUN 18; Creatinine, Ser 1.12; Potassium 3.9; Sodium 138   Recent Lipid Panel Lab Results  Component Value Date/Time   CHOL 149 01/17/2019 06:35 AM   TRIG 180 (H) 01/17/2019 06:35 AM   HDL 33 (L) 01/17/2019 06:35 AM   CHOLHDL 4.5 01/17/2019 06:35 AM   LDLCALC 80 01/17/2019 06:35 AM   LDLDIRECT 150.5 07/03/2009 11:27 AM    Wt Readings from Last 3 Encounters:  03/29/19 167 lb (75.8 kg)  01/31/19 165 lb (74.8 kg)  01/19/19 165 lb 9.1 oz (75.1 kg)     Objective:    Vital Signs:  BP 117/74   Pulse 78   Ht 6' (1.829 m)   Wt 167 lb (75.8 kg)   BMI 22.65 kg/m    VITAL SIGNS:  reviewed GEN:  no acute distress RESPIRATORY:  normal respiratory effort, symmetric expansion NEURO:  alert and oriented x 3, no obvious focal deficit PSYCH:  normal  affect exam limited  ASSESSMENT & PLAN:    1. CAD/Old MI: No angina.  Continue aggressive secondary prevention.  No bleeding problems.  2. HTN: COntrolled. COntinue current meds. Minimize salt intake.  3. Hyperlipidemia: Lipids to be checked next week.  LDL target 70.  4. Tobacco abuse: He needs to stop smoking.  Could try patch if he is unsuccessful.  COVID-19 Education: The signs and symptoms of COVID-19 were discussed with the patient and how to seek care for testing (follow up with PCP or arrange E-visit).  The importance of social distancing was discussed today.  Time:   Today, I have spent 25 minutes with the patient with telehealth technology discussing the above problems.     Medication Adjustments/Labs and Tests Ordered: Current medicines are reviewed at length with the patient  today.  Concerns regarding medicines are outlined above.   Tests Ordered: No orders of the defined types were placed in this encounter.   Medication Changes: No orders of the defined types were placed in this encounter.   Follow Up:  Virtual Visit in 6 month(s)  Signed, Larae Grooms, MD  03/29/2019 10:40 AM    [place]

## 2019-03-29 ENCOUNTER — Other Ambulatory Visit: Payer: Self-pay

## 2019-03-29 ENCOUNTER — Encounter: Payer: Self-pay | Admitting: Interventional Cardiology

## 2019-03-29 ENCOUNTER — Telehealth (INDEPENDENT_AMBULATORY_CARE_PROVIDER_SITE_OTHER): Payer: BC Managed Care – PPO | Admitting: Interventional Cardiology

## 2019-03-29 VITALS — BP 117/74 | HR 78 | Ht 72.0 in | Wt 167.0 lb

## 2019-03-29 DIAGNOSIS — E785 Hyperlipidemia, unspecified: Secondary | ICD-10-CM

## 2019-03-29 DIAGNOSIS — I2510 Atherosclerotic heart disease of native coronary artery without angina pectoris: Secondary | ICD-10-CM | POA: Diagnosis not present

## 2019-03-29 DIAGNOSIS — I252 Old myocardial infarction: Secondary | ICD-10-CM | POA: Diagnosis not present

## 2019-03-29 DIAGNOSIS — Z720 Tobacco use: Secondary | ICD-10-CM | POA: Diagnosis not present

## 2019-03-29 DIAGNOSIS — Z9861 Coronary angioplasty status: Secondary | ICD-10-CM

## 2019-03-29 NOTE — Patient Instructions (Addendum)
Medication Instructions:  Your physician recommends that you continue on your current medications as directed. Please refer to the Current Medication list given to you today.  If you need a refill on your cardiac medications before your next appointment, please call your pharmacy.   Lab work: None Ordered  If you have labs (blood work) drawn today and your tests are completely normal, you will receive your results only by: Marland Kitchen MyChart Message (if you have MyChart) OR . A paper copy in the mail If you have any lab test that is abnormal or we need to change your treatment, we will call you to review the results.  Testing/Procedures: None ordered  Follow-Up: At [facility], you and your health needs are our priority.  As part of our continuing mission to provide you with exceptional heart care, we have created designated Provider Care Teams.  These Care Teams include your primary Cardiologist (physician) and Advanced Practice Providers (APPs -  Physician Assistants and Nurse Practitioners) who all work together to provide you with the care you need, when you need it. . You will need a follow up appointment in 6 months. This will be a VIDEO Visit.  Please call our office 2 months in advance to schedule this appointment.  You may see Casandra Doffing, MD or one of the following Advanced Practice Providers on your designated Care Team:   . Lyda Jester, PA-C . Dayna Dunn, PA-C . Ermalinda Barrios, PA-C  Any Other Special Instructions Will Be Listed Below (If Applicable).  If you decide to quit smoking and need nicotine patches just let us know

## 2019-03-31 DIAGNOSIS — L720 Epidermal cyst: Secondary | ICD-10-CM | POA: Diagnosis not present

## 2019-04-06 DIAGNOSIS — L723 Sebaceous cyst: Secondary | ICD-10-CM | POA: Diagnosis not present

## 2019-04-06 DIAGNOSIS — L720 Epidermal cyst: Secondary | ICD-10-CM | POA: Diagnosis not present

## 2019-04-07 ENCOUNTER — Other Ambulatory Visit: Payer: Self-pay | Admitting: Interventional Cardiology

## 2019-04-07 ENCOUNTER — Other Ambulatory Visit: Payer: Self-pay

## 2019-04-07 ENCOUNTER — Ambulatory Visit: Payer: BC Managed Care – PPO | Admitting: Interventional Cardiology

## 2019-04-07 ENCOUNTER — Other Ambulatory Visit: Payer: BC Managed Care – PPO | Admitting: *Deleted

## 2019-04-07 DIAGNOSIS — E785 Hyperlipidemia, unspecified: Secondary | ICD-10-CM

## 2019-04-07 LAB — LIPID PANEL
Chol/HDL Ratio: 2.9 ratio (ref 0.0–5.0)
Cholesterol, Total: 100 mg/dL (ref 100–199)
HDL: 35 mg/dL — ABNORMAL LOW (ref >39)
LDL Calculated: 23 mg/dL (ref 0–99)
Triglycerides: 208 mg/dL — ABNORMAL HIGH (ref 0–149)
VLDL Cholesterol Cal: 42 mg/dL — ABNORMAL HIGH (ref 5–40)

## 2019-04-07 LAB — HEPATIC FUNCTION PANEL
ALT: 25 IU/L (ref 0–44)
AST: 15 IU/L (ref 0–40)
Albumin: 4.4 g/dL (ref 3.8–4.8)
Alkaline Phosphatase: 72 IU/L (ref 39–117)
Bilirubin Total: 0.4 mg/dL (ref 0.0–1.2)
Bilirubin, Direct: 0.14 mg/dL (ref 0.00–0.40)
Total Protein: 6.6 g/dL (ref 6.0–8.5)

## 2019-04-07 MED ORDER — TICAGRELOR 90 MG PO TABS: 90.0000 mg | ORAL_TABLET | Freq: Two times a day (BID) | ORAL | 3 refills | Status: DC

## 2019-04-11 ENCOUNTER — Telehealth: Payer: Self-pay

## 2019-04-11 NOTE — Telephone Encounter (Signed)
Notes recorded by Frederik Schmidt, RN on 04/11/2019 at 8:22 AM EDT  The patient has been notified of the result and verbalized understanding. All questions (if any) were answered.  Frederik Schmidt, RN 04/11/2019 8:22 AM   ------

## 2019-04-11 NOTE — Telephone Encounter (Signed)
-----   Message from [place], [place] sent at 04/10/2019 11:06 AM EDT ----- LDL is controlled but his TGs are elevated at 208. Normal is < 150. He should work on diet modification. Low fat/ low carb diet. Regular physical activity if able. Walking for exercise 5 days a week would be a good way to stay active. Dr. Irish Lack will continue to follow lipids periodically.

## 2019-04-21 ENCOUNTER — Telehealth: Payer: Self-pay | Admitting: Interventional Cardiology

## 2019-04-21 NOTE — Telephone Encounter (Signed)
I spoke to the patient's wife and informed her that I would have Dr Hassell Done nurse reach out to them early next week to discuss Rehab and Smoking programs possibly offered at [facility].  She indicated that there was no rush and they were just inquiring.

## 2019-04-21 NOTE — Telephone Encounter (Signed)
New Message:     Pt wants to know if Cardiac Rehab have started back? Pt wants to do Cardiac Rehab please. Pt also wants to know if there are any smoking programs  Through [facility]? He wants to stop smoking.

## 2019-04-24 NOTE — Telephone Encounter (Signed)
Returned call to patient. He states that he is wanting help with smoking cessation. Made patient aware that he can call the 1-800-QUIT NOW for assistance. Patient is also wanting nicotine patches as previously discussed with Dr. Irish Lack. Will send to Dr. Irish Lack for dose recommendation.  Patient also asking about doing Cardiac Rehab in person instead of virtual. Will forward to Cardiac Rehab for review and recommendation.

## 2019-04-26 ENCOUNTER — Telehealth (HOSPITAL_COMMUNITY): Payer: Self-pay

## 2019-04-26 ENCOUNTER — Encounter (HOSPITAL_COMMUNITY): Payer: Self-pay

## 2019-04-26 MED ORDER — NICOTINE 21 MG/24HR TD PT24: 21.0000 mg | MEDICATED_PATCH | Freq: Every day | TRANSDERMAL | 2 refills | Status: DC

## 2019-04-26 NOTE — Telephone Encounter (Signed)
Would start with 21 mg patch, with goal to wean down to 14 mg based on his success in the next few months.

## 2019-04-26 NOTE — Telephone Encounter (Signed)
Pt insurance is active and benefits verified through [employer] Co-pay 0, DED $3,000/$3,000 met, out of pocket $5,000/$5,000 met, co-insurance 20%. no pre-authorization required, REF# [id number]  Will contact patient to see if he is interested in the Cardiac Rehab Program. If interested, patient will need to complete follow up appt. Once completed, patient will be contacted for scheduling upon review by the RN Navigator.  2ndary insurance is active and benefits verified through Medicare a/b. Co-pay 0, DED $198/0 met, out of pocket 0/0 met, co-insurance 20%. No pre-authorization required. Passport, 04/26/2019'@1'$ :20pm, REF# [id number]

## 2019-04-26 NOTE — Telephone Encounter (Signed)
Called and spoke to patient's wife (DPR on file). Made her aware that Cardiac Rehab will be in touch to schedule the patient. Also made her aware that we will send in for 21 mg nicotine patches. She verbalized understanding and thanked me for the call.

## 2019-05-03 ENCOUNTER — Telehealth (HOSPITAL_COMMUNITY): Payer: Self-pay | Admitting: *Deleted

## 2019-05-03 NOTE — Telephone Encounter (Signed)
Spoke with patient confirmed appointment for orientation tomorrow. Health history completed.Barnet Pall, RN,BSN 05/03/2019 11:34 AM

## 2019-05-03 NOTE — Telephone Encounter (Signed)
Pt returned CR phone call, pt will come in for orientation on 05/04/2019 @ 9AM and will attend the 915AM class.

## 2019-05-04 ENCOUNTER — Other Ambulatory Visit: Payer: Self-pay

## 2019-05-04 ENCOUNTER — Encounter (HOSPITAL_COMMUNITY)
Admission: RE | Admit: 2019-05-04 | Discharge: 2019-05-04 | Disposition: A | Discharge status: Home / Self Care | Payer: BC Managed Care – PPO | Source: Ambulatory Visit | Attending: Interventional Cardiology | Admitting: Interventional Cardiology

## 2019-05-04 VITALS — BP 102/60 | HR 74 | Ht 71.25 in | Wt 165.8 lb

## 2019-05-04 DIAGNOSIS — Z955 Presence of coronary angioplasty implant and graft: Secondary | ICD-10-CM

## 2019-05-04 DIAGNOSIS — I2102 ST elevation (STEMI) myocardial infarction involving left anterior descending coronary artery: Secondary | ICD-10-CM

## 2019-05-04 LAB — GLUCOSE, CAPILLARY: Glucose-Capillary: 167 mg/dL — ABNORMAL HIGH (ref 70–99)

## 2019-05-04 NOTE — Progress Notes (Addendum)
Cardiac Individual Treatment Plan  Patient Details  Name: Adam Farmer MRN: [id number] Date of Birth: 1951/03/03 Referring Provider:     [place] from 05/04/2019 in [place]  Referring Provider  Dr. Irish Lack       Initial Encounter Date:    CARDIAC REHAB PHASE II ORIENTATION from 05/04/2019 in [place]  Date  05/04/19      Visit Diagnosis: ST elevation myocardial infarction involving left anterior descending (LAD) coronary artery [facility])  Status post coronary artery stent placement  Patient's Home Medications on Admission:  Current Outpatient Medications:  .  aspirin EC 81 MG EC tablet, Take 1 tablet (81 mg total) by mouth daily., Disp: 30 tablet, Rfl: 6 .  atorvastatin (LIPITOR) 80 MG tablet, Take 1 tablet (80 mg total) by mouth daily., Disp: 90 tablet, Rfl: 1 .  diphenhydrAMINE (BENADRYL ALLERGY) 25 MG tablet, Take 25 mg ( 1 tablet) or 12.5 mg ( 1/2 tablet) every 4 to 6 hours as needed for rash, Disp: 30 tablet, Rfl: 0 .  escitalopram (LEXAPRO) 10 MG tablet, Take 10 mg by mouth daily., Disp: , Rfl:  .  glimepiride (AMARYL) 1 MG tablet, Take 1 mg by mouth daily with breakfast., Disp: , Rfl:  .  lisinopril (ZESTRIL) 5 MG tablet, TAKE 1 TABLET BY MOUTH EVERY DAY, Disp: 90 tablet, Rfl: 1 .  metFORMIN (GLUCOPHAGE) 500 MG tablet, Take 500 mg by mouth 2 (two) times daily with a meal.  , Disp: , Rfl:  .  metoprolol tartrate (LOPRESSOR) 25 MG tablet, TAKE 1 TABLET BY MOUTH TWICE A DAY, Disp: 180 tablet, Rfl: 1 .  nicotine (NICODERM CQ - DOSED IN MG/24 HOURS) 21 mg/24hr patch, Place 1 patch (21 mg total) onto the skin daily., Disp: 28 patch, Rfl: 2 .  nitroGLYCERIN (NITROSTAT) 0.4 MG SL tablet, Place 1 tablet (0.4 mg total) under the tongue every 5 (five) minutes x 3 doses as needed for chest pain., Disp: 25 tablet, Rfl: 2 .  ticagrelor (BRILINTA) 90 MG TABS tablet, Take 1 tablet (90 mg total) by  mouth 2 (two) times daily., Disp: 180 tablet, Rfl: 3  Past Medical History: Past Medical History:  Diagnosis Date  . ABNORMAL GLUCOSE NEC 05/03/2007  . ANXIETY 05/03/2007  . COLONIC POLYPS, HX OF 07/09/2009  . HYPERLIPIDEMIA, MIXED 05/03/2007  . STEMI (ST elevation myocardial infarction) ([place])    01/16/19 PCI to LAD, RCA  . TOBACCO USE 05/03/2007    Tobacco Use: Social History   Tobacco Use  Smoking Status Current Every Day Smoker  . Packs/day: 1.00  . Years: 26.00  . Pack years: 26.00  . Types: Cigarettes  Smokeless Tobacco Never Used  Tobacco Comment   discussed strategy    Labs: Recent Review Flowsheet Data    Labs for ITP Cardiac and Pulmonary Rehab Latest Ref Rng & Units 07/03/2009 07/09/2009 01/16/2019 01/17/2019 04/07/2019   Cholestrol 100 - 199 mg/dL 202(H) - - 149 100   LDLCALC 0 - 99 mg/dL - - - 80 23   LDLDIRECT mg/dL 150.5 - - - -   HDL >39 mg/dL 30.30(L) - - 33(L) 35(L)   Trlycerides 0 - 149 mg/dL 141.0 - - 180(H) 208(H)   Hemoglobin A1c 4.8 - 5.6 % - 7.6(H) - 7.5(H) -   PHART 7.350 - 7.450 - - 7.348(L) - -   PCO2ART 32.0 - 48.0 mmHg - - 39.2 - -   HCO3 20.0 -  28.0 mmol/L - - 21.5 - -   TCO2 22 - 32 mmol/L - - 23 - -   ACIDBASEDEF 0.0 - 2.0 mmol/L - - 4.0(H) - -   O2SAT % - - 97.0 - -      Capillary Blood Glucose: Lab Results  Component Value Date   GLUCAP 167 (H) 05/04/2019   GLUCAP 242 (H) 01/19/2019   GLUCAP 116 (H) 01/18/2019   GLUCAP 125 (H) 01/18/2019   GLUCAP 194 (H) 01/18/2019     Exercise Target Goals: Exercise Program Goal: Individual exercise prescription set using results from initial 6 min walk test and THRR while considering  patient's activity barriers and safety.   Exercise Prescription Goal: Initial exercise prescription builds to 30-45 minutes a day of aerobic activity, 2-3 days per week.  Home exercise guidelines will be given to patient during program as part of exercise prescription that the participant will acknowledge.  Activity  Barriers & Risk Stratification: Activity Barriers & Cardiac Risk Stratification - 05/04/19 1025      Activity Barriers & Cardiac Risk Stratification   Activity Barriers  Deconditioning;Muscular Weakness    Cardiac Risk Stratification  Moderate       6 Minute Walk: 6 Minute Walk    Row Name 05/04/19 1021         6 Minute Walk   Phase  Initial     Distance  1335 feet     Walk Time  6 minutes     # of Rest Breaks  0     MPH  2.5     METS  3.3     RPE  11     Perceived Dyspnea   0     VO2 Peak  11.56     Symptoms  No     Resting HR  74 bpm     Resting BP  102/60     Resting Oxygen Saturation   96 %     Exercise Oxygen Saturation  during 6 min walk  97 %     Max Ex. HR  82 bpm     Max Ex. BP  120/64     2 Minute Post BP  104/62        Oxygen Initial Assessment:   Oxygen Re-Evaluation:   Oxygen Discharge (Final Oxygen Re-Evaluation):   Initial Exercise Prescription: Initial Exercise Prescription - 05/04/19 1000      Date of Initial Exercise RX and Referring Provider   Date  05/04/19    Referring Provider  Dr. Irish Lack     Expected Discharge Date  06/30/19      Treadmill   MPH  2.4    Grade  1    Minutes  15      NuStep   Level  3    SPM  85    Minutes  15    METs  2.5      Prescription Details   Frequency (times per week)  3    Duration  Progress to 30 minutes of continuous aerobic without signs/symptoms of physical distress      Intensity   THRR 40-80% of Max Heartrate  66-123    Ratings of Perceived Exertion  11-13      Progression   Progression  Continue to progress workloads to maintain intensity without signs/symptoms of physical distress.      Resistance Training   Training Prescription  Yes    Weight  4 lbs.  Reps  10-15       Perform Capillary Blood Glucose checks as needed.  Exercise Prescription Changes:   Exercise Comments:   Exercise Goals and Review: Exercise Goals    Row Name 05/04/19 1027              Exercise Goals   Increase Physical Activity  Yes       Intervention  Provide advice, education, support and counseling about physical activity/exercise needs.;Develop an individualized exercise prescription for aerobic and resistive training based on initial evaluation findings, risk stratification, comorbidities and participant's personal goals.       Expected Outcomes  Short Term: Attend rehab on a regular basis to increase amount of physical activity.;Long Term: Add in home exercise to make exercise part of routine and to increase amount of physical activity.;Long Term: Exercising regularly at least 3-5 days a week.       Increase Strength and Stamina  Yes       Intervention  Provide advice, education, support and counseling about physical activity/exercise needs.;Develop an individualized exercise prescription for aerobic and resistive training based on initial evaluation findings, risk stratification, comorbidities and participant's personal goals.       Expected Outcomes  Short Term: Increase workloads from initial exercise prescription for resistance, speed, and METs.;Short Term: Perform resistance training exercises routinely during rehab and add in resistance training at home;Long Term: Improve cardiorespiratory fitness, muscular endurance and strength as measured by increased METs and functional capacity (6MWT)       Able to understand and use rate of perceived exertion (RPE) scale  Yes       Intervention  Provide education and explanation on how to use RPE scale       Expected Outcomes  Short Term: Able to use RPE daily in rehab to express subjective intensity level;Long Term:  Able to use RPE to guide intensity level when exercising independently       Knowledge and understanding of Target Heart Rate Range (THRR)  Yes       Intervention  Provide education and explanation of THRR including how the numbers were predicted and where they are located for reference       Expected Outcomes  Short  Term: Able to state/look up THRR;Long Term: Able to use THRR to govern intensity when exercising independently;Short Term: Able to use daily as guideline for intensity in rehab       Able to check pulse independently  Yes       Intervention  Provide education and demonstration on how to check pulse in carotid and radial arteries.;Review the importance of being able to check your own pulse for safety during independent exercise       Expected Outcomes  Short Term: Able to explain why pulse checking is important during independent exercise;Long Term: Able to check pulse independently and accurately       Understanding of Exercise Prescription  Yes       Intervention  Provide education, explanation, and written materials on patient's individual exercise prescription       Expected Outcomes  Short Term: Able to explain program exercise prescription;Long Term: Able to explain home exercise prescription to exercise independently          Exercise Goals Re-Evaluation :   Discharge Exercise Prescription (Final Exercise Prescription Changes):   Nutrition:  Target Goals: Understanding of nutrition guidelines, daily intake of sodium 1500mg , cholesterol 200mg , calories 30% from fat and 7% or less from saturated fats, daily  to have 5 or more servings of fruits and vegetables.  Biometrics: Pre Biometrics - 05/04/19 1028      Pre Biometrics   Height  5' 11.25" (1.81 m)    Weight  75.2 kg    Waist Circumference  34 inches    Hip Circumference  38 inches    Waist to Hip Ratio  0.89 %    BMI (Calculated)  22.95    Triceps Skinfold  17 mm    % Body Fat  23.2 %    Grip Strength  43 kg    Flexibility  12.5 in    Single Leg Stand  10.56 seconds        Nutrition Therapy Plan and Nutrition Goals:   Nutrition Assessments:   Nutrition Goals Re-Evaluation:   Nutrition Goals Re-Evaluation:   Nutrition Goals Discharge (Final Nutrition Goals Re-Evaluation):   Psychosocial: Target Goals:  Acknowledge presence or absence of significant depression and/or stress, maximize coping skills, provide positive support system. Participant is able to verbalize types and ability to use techniques and skills needed for reducing stress and depression.  Initial Review & Psychosocial Screening: Initial Psych Review & Screening - 05/04/19 1022      Initial Review   Current issues with  Current Depression   Reports some occasional depression.     Family Dynamics   Good Support System?  Yes   Pt reports support from his wife and family.     Barriers   Psychosocial barriers to participate in program  The patient should benefit from training in stress management and relaxation.      Screening Interventions   Interventions  Encouraged to exercise;Provide feedback about the scores to participant    Expected Outcomes  Short Term goal: Identification and review with participant of any Quality of Life or Depression concerns found by scoring the questionnaire.;Long Term goal: The participant improves quality of Life and PHQ9 Scores as seen by post scores and/or verbalization of changes       Quality of Life Scores: Quality of Life - 05/04/19 1011      Quality of Life   Select  Quality of Life      Quality of Life Scores   Health/Function Pre  20.43 %    Socioeconomic Pre  19.29 %    Psych/Spiritual Pre  19.79 %    Family Pre  19.2 %    GLOBAL Pre  19.88 %      Scores of 19 and below usually indicate a poorer quality of life in these areas.  A difference of  2-3 points is a clinically meaningful difference.  A difference of 2-3 points in the total score of the Quality of Life Index has been associated with significant improvement in overall quality of life, self-image, physical symptoms, and general health in studies assessing change in quality of life.  PHQ-9: Recent Review Flowsheet Data    Depression screen [facility] 2/9 05/04/2019   Decreased Interest 0   Down, Depressed, Hopeless 1   PHQ  - 2 Score 1     Interpretation of Total Score  Total Score Depression Severity:  1-4 = Minimal depression, 5-9 = Mild depression, 10-14 = Moderate depression, 15-19 = Moderately severe depression, 20-27 = Severe depression   Psychosocial Evaluation and Intervention:   Psychosocial Re-Evaluation:   Psychosocial Discharge (Final Psychosocial Re-Evaluation):   Vocational Rehabilitation: Provide vocational rehab assistance to qualifying candidates.   Vocational Rehab Evaluation & Intervention: Vocational Rehab - 05/04/19 1025  Initial Vocational Rehab Evaluation & Intervention   Assessment shows need for Vocational Rehabilitation  No   Pt is retired.      Education: Education Goals: Education classes will be provided on a weekly basis, covering required topics. Participant will state understanding/return demonstration of topics presented.  Learning Barriers/Preferences: Learning Barriers/Preferences - 05/04/19 1030      Learning Barriers/Preferences   Learning Barriers  Hearing;Sight    Learning Preferences  Individual Instruction;Skilled Demonstration       Education Topics: Count Your Pulse:  -Group instruction provided by verbal instruction, demonstration, patient participation and written materials to support subject.  Instructors address importance of being able to find your pulse and how to count your pulse when at home without a heart monitor.  Patients get hands on experience counting their pulse with staff help and individually.   Heart Attack, Angina, and Risk Factor Modification:  -Group instruction provided by verbal instruction, video, and written materials to support subject.  Instructors address signs and symptoms of angina and heart attacks.    Also discuss risk factors for heart disease and how to make changes to improve heart health risk factors.   Functional Fitness:  -Group instruction provided by verbal instruction, demonstration, patient  participation, and written materials to support subject.  Instructors address safety measures for doing things around the house.  Discuss how to get up and down off the floor, how to pick things up properly, how to safely get out of a chair without assistance, and balance training.   Meditation and Mindfulness:  -Group instruction provided by verbal instruction, patient participation, and written materials to support subject.  Instructor addresses importance of mindfulness and meditation practice to help reduce stress and improve awareness.  Instructor also leads participants through a meditation exercise.    Stretching for Flexibility and Mobility:  -Group instruction provided by verbal instruction, patient participation, and written materials to support subject.  Instructors lead participants through series of stretches that are designed to increase flexibility thus improving mobility.  These stretches are additional exercise for major muscle groups that are typically performed during regular warm up and cool down.   Hands Only CPR:  -Group verbal, video, and participation provides a basic overview of AHA guidelines for community CPR. Role-play of emergencies allow participants the opportunity to practice calling for help and chest compression technique with discussion of AED use.   Hypertension: -Group verbal and written instruction that provides a basic overview of hypertension including the most recent diagnostic guidelines, risk factor reduction with self-care instructions and medication management.    Nutrition I class: Heart Healthy Eating:  -Group instruction provided by PowerPoint slides, verbal discussion, and written materials to support subject matter. The instructor gives an explanation and review of the Therapeutic Lifestyle Changes diet recommendations, which includes a discussion on lipid goals, dietary fat, sodium, fiber, plant stanol/sterol esters, sugar, and the components of  a well-balanced, healthy diet.   Nutrition II class: Lifestyle Skills:  -Group instruction provided by PowerPoint slides, verbal discussion, and written materials to support subject matter. The instructor gives an explanation and review of label reading, grocery shopping for heart health, heart healthy recipe modifications, and ways to make healthier choices when eating out.   Diabetes Question & Answer:  -Group instruction provided by PowerPoint slides, verbal discussion, and written materials to support subject matter. The instructor gives an explanation and review of diabetes co-morbidities, pre- and post-prandial blood glucose goals, pre-exercise blood glucose goals, signs, symptoms, and treatment of  hypoglycemia and hyperglycemia, and foot care basics.   Diabetes Blitz:  -Group instruction provided by PowerPoint slides, verbal discussion, and written materials to support subject matter. The instructor gives an explanation and review of the physiology behind type 1 and type 2 diabetes, diabetes medications and rational behind using different medications, pre- and post-prandial blood glucose recommendations and Hemoglobin A1c goals, diabetes diet, and exercise including blood glucose guidelines for exercising safely.    Portion Distortion:  -Group instruction provided by PowerPoint slides, verbal discussion, written materials, and food models to support subject matter. The instructor gives an explanation of serving size versus portion size, changes in portions sizes over the last 20 years, and what consists of a serving from each food group.   Stress Management:  -Group instruction provided by verbal instruction, video, and written materials to support subject matter.  Instructors review role of stress in heart disease and how to cope with stress positively.     Exercising on Your Own:  -Group instruction provided by verbal instruction, power point, and written materials to support  subject.  Instructors discuss benefits of exercise, components of exercise, frequency and intensity of exercise, and end points for exercise.  Also discuss use of nitroglycerin and activating EMS.  Review options of places to exercise outside of rehab.  Review guidelines for sex with heart disease.   Cardiac Drugs I:  -Group instruction provided by verbal instruction and written materials to support subject.  Instructor reviews cardiac drug classes: antiplatelets, anticoagulants, beta blockers, and statins.  Instructor discusses reasons, side effects, and lifestyle considerations for each drug class.   Cardiac Drugs II:  -Group instruction provided by verbal instruction and written materials to support subject.  Instructor reviews cardiac drug classes: angiotensin converting enzyme inhibitors (ACE-I), angiotensin II receptor blockers (ARBs), nitrates, and calcium channel blockers.  Instructor discusses reasons, side effects, and lifestyle considerations for each drug class.   Anatomy and Physiology of the Circulatory System:  Group verbal and written instruction and models provide basic cardiac anatomy and physiology, with the coronary electrical and arterial systems. Review of: AMI, Angina, Valve disease, Heart Failure, Peripheral Artery Disease, Cardiac Arrhythmia, Pacemakers, and the ICD.   Other Education:  -Group or individual verbal, written, or video instructions that support the educational goals of the cardiac rehab program.   Holiday Eating Survival Tips:  -Group instruction provided by PowerPoint slides, verbal discussion, and written materials to support subject matter. The instructor gives patients tips, tricks, and techniques to help them not only survive but enjoy the holidays despite the onslaught of food that accompanies the holidays.   Knowledge Questionnaire Score: Knowledge Questionnaire Score - 05/04/19 1011      Knowledge Questionnaire Score   Pre Score  25/28        Core Components/Risk Factors/Patient Goals at Admission: Personal Goals and Risk Factors at Admission - 05/04/19 1031      Core Components/Risk Factors/Patient Goals on Admission    Weight Management  Yes;Weight Maintenance    Intervention  Weight Management: Develop a combined nutrition and exercise program designed to reach desired caloric intake, while maintaining appropriate intake of nutrient and fiber, sodium and fats, and appropriate energy expenditure required for the weight goal.;Weight Management: Provide education and appropriate resources to help participant work on and attain dietary goals.    Admit Weight  165 lb 12.6 oz (75.2 kg)    Expected Outcomes  Short Term: Continue to assess and modify interventions until short term weight is achieved;Long Term:  Adherence to nutrition and physical activity/exercise program aimed toward attainment of established weight goal;Weight Maintenance: Understanding of the daily nutrition guidelines, which includes 25-35% calories from fat, 7% or less cal from saturated fats, less than 200mg  cholesterol, less than 1.5gm of sodium, & 5 or more servings of fruits and vegetables daily;Understanding recommendations for meals to include 15-35% energy as protein, 25-35% energy from fat, 35-60% energy from carbohydrates, less than 200mg  of dietary cholesterol, 20-35 gm of total fiber daily;Understanding of distribution of calorie intake throughout the day with the consumption of 4-5 meals/snacks    Tobacco Cessation  Yes    Number of packs per day  0.5    Intervention  Assist the participant in steps to quit. Provide individualized education and counseling about committing to Tobacco Cessation, relapse prevention, and pharmacological support that can be provided by physician.;Advice worker, assist with locating and accessing local/national Quit Smoking programs, and support quit date choice.    Expected Outcomes  Short Term: Will demonstrate  readiness to quit, by selecting a quit date.;Long Term: Complete abstinence from all tobacco products for at least 12 months from quit date.;Short Term: Will quit all tobacco product use, adhering to prevention of relapse plan.    Diabetes  Yes    Intervention  Provide education about signs/symptoms and action to take for hypo/hyperglycemia.;Provide education about proper nutrition, including hydration, and aerobic/resistive exercise prescription along with prescribed medications to achieve blood glucose in normal ranges: Fasting glucose 65-99 mg/dL    Expected Outcomes  Short Term: Participant verbalizes understanding of the signs/symptoms and immediate care of hyper/hypoglycemia, proper foot care and importance of medication, aerobic/resistive exercise and nutrition plan for blood glucose control.;Long Term: Attainment of HbA1C < 7%.    Lipids  Yes    Intervention  Provide education and support for participant on nutrition & aerobic/resistive exercise along with prescribed medications to achieve LDL 70mg , HDL >40mg .    Expected Outcomes  Short Term: Participant states understanding of desired cholesterol values and is compliant with medications prescribed. Participant is following exercise prescription and nutrition guidelines.;Long Term: Cholesterol controlled with medications as prescribed, with individualized exercise RX and with personalized nutrition plan. Value goals: LDL < 70mg , HDL > 40 mg.       Core Components/Risk Factors/Patient Goals Review:    Core Components/Risk Factors/Patient Goals at Discharge (Final Review):    ITP Comments: ITP Comments    Row Name 05/04/19 1021           ITP Comments  Dr. Fransico Him, Medical Director          Comments: Patient attended orientation on 05/04/2019 to review rules and guidelines for program.  Completed 6 minute walk test, Intitial ITP, and exercise prescription.  VSS. Telemetry-SR.  Asymptomatic. Safety measures and social distancing  in place per CDC guidelines.

## 2019-05-04 NOTE — Progress Notes (Addendum)
Cardiac Rehab Medication Review by a Pharmacist  Does the patient  feel that his/her medications are working for him/her?  yes  Has the patient been experiencing any side effects to the medications prescribed?  yes  Does the patient measure his/her own blood pressure or blood glucose at home?  yes   Does the patient have any problems obtaining medications due to transportation or finances?   Not at the moment but he states that he could potentially have issues affording ticagrelor in January when his insurance changes.  Understanding of regimen: excellent Understanding of indications: excellent Potential of compliance: excellent    Pharmacist comments: Pt reports taking another medication for his diabetes that is not on his medication list.  Medication list updated.    Adam Farmer 05/04/2019 9:48 AM

## 2019-05-08 ENCOUNTER — Other Ambulatory Visit: Payer: Self-pay

## 2019-05-08 ENCOUNTER — Encounter (HOSPITAL_COMMUNITY)
Admission: RE | Admit: 2019-05-08 | Discharge: 2019-05-08 | Disposition: A | Discharge status: Home / Self Care | Payer: BC Managed Care – PPO | Source: Ambulatory Visit | Attending: Interventional Cardiology | Admitting: Interventional Cardiology

## 2019-05-08 DIAGNOSIS — Z955 Presence of coronary angioplasty implant and graft: Secondary | ICD-10-CM

## 2019-05-08 DIAGNOSIS — I2102 ST elevation (STEMI) myocardial infarction involving left anterior descending coronary artery: Secondary | ICD-10-CM

## 2019-05-08 LAB — GLUCOSE, CAPILLARY
Glucose-Capillary: 203 mg/dL — ABNORMAL HIGH (ref 70–99)
Glucose-Capillary: 58 mg/dL — ABNORMAL LOW (ref 70–99)
Glucose-Capillary: 100 mg/dL — ABNORMAL HIGH (ref 70–99)

## 2019-05-08 NOTE — Progress Notes (Signed)
Daily Session Note  Patient Details  Name: Adam Farmer MRN: [id number] Date of Birth: 24-May-1951 Referring Provider:     [place] from 05/04/2019 in [place]  Referring Provider  Dr. Irish Lack       Encounter Date: 05/08/2019  Check In: Session Check In - 05/08/19 0944      Check-In   Supervising physician immediately available to respond to emergencies  [facility] immediately available    Physician(s)  Dr. Marthenia Rolling    Location  [facility]    Staff Present  Jiles Garter, RN, BSN;Brittany Durene Fruits, BS, ACSM CEP, Exercise Physiologist;Dalton Kris Mouton, MS, Exercise Physiologist;Portia Rollene Rotunda, RN, Deland Pretty, MS, ACSM CEP, Exercise Physiologist    Virtual Visit  No    Medication changes reported      No    Fall or balance concerns reported     No    Tobacco Cessation  No Change    Warm-up and Cool-down  Performed on first and last piece of equipment    Resistance Training Performed  Yes    VAD Patient?  No    PAD/SET Patient?  No      Pain Assessment   Currently in Pain?  No/denies    Pain Score  0-No pain    Multiple Pain Sites  No       Capillary Blood Glucose: Results for orders placed or performed during the hospital encounter of 05/08/19 (from the past 24 hour(s))  Glucose, capillary     Status: Abnormal   Collection Time: 05/08/19  9:15 AM  Result Value Ref Range   Glucose-Capillary 203 (H) 70 - 99 mg/dL  Glucose, capillary     Status: Abnormal   Collection Time: 05/08/19 10:18 AM  Result Value Ref Range   Glucose-Capillary 58 (L) 70 - 99 mg/dL   Comment 1 Notify RN   Glucose, capillary     Status: Abnormal   Collection Time: 05/08/19 10:37 AM  Result Value Ref Range   Glucose-Capillary 100 (H) 70 - 99 mg/dL    Exercise Prescription Changes - 05/08/19 1100      Response to Exercise   Blood Pressure (Admit)  90/62    Blood Pressure (Exercise)  104/60    Blood Pressure  (Exit)  102/60    Heart Rate (Admit)  76 bpm    Heart Rate (Exercise)  96 bpm    Heart Rate (Exit)  83 bpm    Rating of Perceived Exertion (Exercise)  10    Symptoms  none    Comments  Pt first day of CR program.     Duration  Continue with 30 min of aerobic exercise without signs/symptoms of physical distress.    Intensity  THRR unchanged      Progression   Progression  Continue to progress workloads to maintain intensity without signs/symptoms of physical distress.    Average METs  3.2      Resistance Training   Training Prescription  Yes    Weight  4 lbs.     Reps  10-15    Time  10 Minutes      Treadmill   MPH  2.4    Grade  1    Minutes  15      NuStep   Level  3    SPM  85    Minutes  15    METs  3.4       Social  History   Tobacco Use  Smoking Status Current Every Day Smoker  . Packs/day: 1.00  . Years: 26.00  . Pack years: 26.00  . Types: Cigarettes  Smokeless Tobacco Never Used  Tobacco Comment   discussed strategy    Goals Met:  Exercise tolerated well  Goals Unmet:  Not Applicable  Comments: Pt started cardiac rehab today.  Pt tolerated light exercise without difficulty. VSS, telemetry-SR, asymptomatic.  Medication list reconciled. Pt denies barriers to medicaiton compliance.  Kamsiyochukwu's CBG after exercise was 58.  10 ounces of lemonade and ginger ale mix given.  Recheck CBG 100  PSYCHOSOCIAL ASSESSMENT:  PHQ-1. Pt exhibits positive coping skills, hopeful outlook with supportive family. No psychosocial needs identified at this time, no psychosocial interventions necessary.  Pt oriented to exercise equipment and routine.    Understanding verbalized.    Dr. Fransico Him is Medical Director for Cardiac Rehab at [facility].

## 2019-05-10 ENCOUNTER — Other Ambulatory Visit: Payer: Self-pay

## 2019-05-10 ENCOUNTER — Encounter (HOSPITAL_COMMUNITY)
Admission: RE | Admit: 2019-05-10 | Discharge: 2019-05-10 | Disposition: A | Discharge status: Home / Self Care | Payer: BC Managed Care – PPO | Source: Ambulatory Visit | Attending: Interventional Cardiology | Admitting: Interventional Cardiology

## 2019-05-10 DIAGNOSIS — I2102 ST elevation (STEMI) myocardial infarction involving left anterior descending coronary artery: Secondary | ICD-10-CM

## 2019-05-10 DIAGNOSIS — Z955 Presence of coronary angioplasty implant and graft: Secondary | ICD-10-CM

## 2019-05-10 LAB — GLUCOSE, CAPILLARY
Glucose-Capillary: 157 mg/dL — ABNORMAL HIGH (ref 70–99)
Glucose-Capillary: 136 mg/dL — ABNORMAL HIGH (ref 70–99)
Glucose-Capillary: 81 mg/dL (ref 70–99)

## 2019-05-10 NOTE — Progress Notes (Signed)
Daily Session Note  Patient Details  Name: Adam Farmer MRN: [id number] Date of Birth: 1951/02/03 Referring Provider:     [place] from 05/04/2019 in [place]  Referring Provider  Dr. Irish Lack       Encounter Date: 05/10/2019  Check In: Session Check In - 05/10/19 0940      Check-In   Supervising physician immediately available to respond to emergencies  [facility] immediately available    Physician(s)  Dr. Earnest Conroy    Location  [facility]    Staff Present  Jiles Garter, RN, BSN;Brittany Durene Fruits, BS, ACSM CEP, Exercise Physiologist;Dalton Kris Mouton, MS, Exercise Physiologist;Portia Rollene Rotunda, RN, Deland Pretty, MS, ACSM CEP, Exercise Physiologist;Ivey Nembhard, RN, BSN    Virtual Visit  No    Medication changes reported      No    Fall or balance concerns reported     No    Tobacco Cessation  No Change    Warm-up and Cool-down  Performed on first and last piece of equipment    Resistance Training Performed  No    VAD Patient?  No    PAD/SET Patient?  No      Pain Assessment   Currently in Pain?  No/denies    Pain Score  0-No pain    Multiple Pain Sites  No       Capillary Blood Glucose: Results for orders placed or performed during the hospital encounter of 05/10/19 (from the past 24 hour(s))  Glucose, capillary     Status: Abnormal   Collection Time: 05/10/19  9:10 AM  Result Value Ref Range   Glucose-Capillary 157 (H) 70 - 99 mg/dL  Glucose, capillary     Status: None   Collection Time: 05/10/19  9:45 AM  Result Value Ref Range   Glucose-Capillary 81 70 - 99 mg/dL  Glucose, capillary     Status: Abnormal   Collection Time: 05/10/19 10:08 AM  Result Value Ref Range   Glucose-Capillary 136 (H) 70 - 99 mg/dL      Social History   Tobacco Use  Smoking Status Current Every Day Smoker  . Packs/day: 1.00  . Years: 26.00  . Pack years: 26.00  . Types: Cigarettes  Smokeless  Tobacco Never Used  Tobacco Comment   discussed strategy    Goals Met:  No report of cardiac concerns or symptoms  Goals Unmet:  Not Applicable  Comments: Adam Farmer reported feeling a little weak after getting off the nustep. Pre exercise CBG 157. CBG noted at 81. Patient was given lemonade and graham crackers. Recheck CBG 136. Adam Farmer said said he ate a waffle and peanut butter this morning at 10:30. Patient encouraged to add a healthy protein to his diet and to eat something right before coming to exercise. Adam Farmer says that he has not taken his Amaryl today. Adam Farmer also reports that has been getting some low CBG readings at home. The last time Adam Farmer checked his CBG was on last Thursday as he has ran out of strips and says that he needs a new CBG meter. Will notify Dr Julianne Rice office and ask them to contact the patient.Barnet Pall, RN,BSN 05/10/2019 11:11 AM  Dr. Fransico Him is Medical Director for Cardiac Rehab at [facility].

## 2019-05-12 ENCOUNTER — Encounter (HOSPITAL_COMMUNITY)
Admission: RE | Admit: 2019-05-12 | Discharge: 2019-05-12 | Disposition: A | Discharge status: Home / Self Care | Payer: Medicare Other | Source: Ambulatory Visit | Attending: Interventional Cardiology | Admitting: Interventional Cardiology

## 2019-05-12 ENCOUNTER — Other Ambulatory Visit: Payer: Self-pay

## 2019-05-12 DIAGNOSIS — I2102 ST elevation (STEMI) myocardial infarction involving left anterior descending coronary artery: Secondary | ICD-10-CM | POA: Insufficient documentation

## 2019-05-12 DIAGNOSIS — Z955 Presence of coronary angioplasty implant and graft: Secondary | ICD-10-CM | POA: Insufficient documentation

## 2019-05-12 LAB — GLUCOSE, CAPILLARY
Glucose-Capillary: 132 mg/dL — ABNORMAL HIGH (ref 70–99)
Glucose-Capillary: 85 mg/dL (ref 70–99)

## 2019-05-15 ENCOUNTER — Other Ambulatory Visit: Payer: Self-pay

## 2019-05-15 ENCOUNTER — Telehealth: Payer: Self-pay | Admitting: Cardiology

## 2019-05-15 ENCOUNTER — Encounter (HOSPITAL_COMMUNITY)
Admission: RE | Admit: 2019-05-15 | Discharge: 2019-05-15 | Disposition: A | Discharge status: Home / Self Care | Payer: Medicare Other | Source: Ambulatory Visit | Attending: Interventional Cardiology | Admitting: Interventional Cardiology

## 2019-05-15 DIAGNOSIS — Z955 Presence of coronary angioplasty implant and graft: Secondary | ICD-10-CM

## 2019-05-15 DIAGNOSIS — I2102 ST elevation (STEMI) myocardial infarction involving left anterior descending coronary artery: Secondary | ICD-10-CM

## 2019-05-15 MED ORDER — LISINOPRIL 2.5 MG PO TABS: 2.5000 mg | ORAL_TABLET | Freq: Every day | ORAL | 3 refills | Status: DC

## 2019-05-15 NOTE — Telephone Encounter (Signed)
I was called by Verdis Frederickson at cardiac rehab.  At cardiac rehab session today the patient arrived with blood pressure of 92/58.  Exercise blood pressure was 128/78.  After exercise and water blood pressure in the 90s.  Patient occasionally has lightheadedness.  He says that he did drink 2 glasses of water prior to cardiac rehab today.  Heart rates in the 80s-90s.  I will decrease his lisinopril from 5 mg daily to 2.5 mg daily.  This was reviewed with the patient over the phone and Verdis Frederickson will reinforce with patient.

## 2019-05-15 NOTE — Progress Notes (Signed)
Entry blood pressure 92/56. Blood pressure noted at 128/78 on the nustep. Post exercise blood pressure 98/60. Patient asymptomatic Given water recheck blood pressure 91/60 sitting. Blood pressure 99/74 standing. Adam Farmer reports that he sometimes feels lightheaded at home. Adam Ades NP called and notified about today's blood pressures and symptoms at home. Adam Farmer told Mr Adam Farmer over the phone to decrease his lisinopril to 2.5 mg once a day. Patient states understanding about medication change. I wrote the new dose for the patient. Will continue to monitor the patient throughout  the program.Adam Farmer Adam Maxon, RN,BSN 05/16/2019 3:45 PM

## 2019-05-17 ENCOUNTER — Encounter (HOSPITAL_COMMUNITY)
Admission: RE | Admit: 2019-05-17 | Discharge: 2019-05-17 | Disposition: A | Discharge status: Home / Self Care | Payer: Medicare Other | Source: Ambulatory Visit | Attending: Interventional Cardiology | Admitting: Interventional Cardiology

## 2019-05-17 ENCOUNTER — Other Ambulatory Visit: Payer: Self-pay

## 2019-05-17 DIAGNOSIS — I2102 ST elevation (STEMI) myocardial infarction involving left anterior descending coronary artery: Secondary | ICD-10-CM | POA: Diagnosis not present

## 2019-05-17 DIAGNOSIS — Z955 Presence of coronary angioplasty implant and graft: Secondary | ICD-10-CM

## 2019-05-19 ENCOUNTER — Encounter (HOSPITAL_COMMUNITY): Payer: Medicare Other

## 2019-05-22 ENCOUNTER — Other Ambulatory Visit: Payer: Self-pay

## 2019-05-22 ENCOUNTER — Encounter (HOSPITAL_COMMUNITY)
Admission: RE | Admit: 2019-05-22 | Discharge: 2019-05-22 | Disposition: A | Discharge status: Home / Self Care | Payer: Medicare Other | Source: Ambulatory Visit | Attending: Interventional Cardiology | Admitting: Interventional Cardiology

## 2019-05-22 DIAGNOSIS — Z955 Presence of coronary angioplasty implant and graft: Secondary | ICD-10-CM

## 2019-05-22 DIAGNOSIS — I2102 ST elevation (STEMI) myocardial infarction involving left anterior descending coronary artery: Secondary | ICD-10-CM | POA: Diagnosis not present

## 2019-05-24 ENCOUNTER — Encounter (HOSPITAL_COMMUNITY)
Admission: RE | Admit: 2019-05-24 | Discharge: 2019-05-24 | Disposition: A | Discharge status: Home / Self Care | Payer: Medicare Other | Source: Ambulatory Visit | Attending: Interventional Cardiology | Admitting: Interventional Cardiology

## 2019-05-24 ENCOUNTER — Ambulatory Visit: Payer: BC Managed Care – PPO | Admitting: Family Medicine

## 2019-05-24 ENCOUNTER — Other Ambulatory Visit: Payer: Self-pay

## 2019-05-24 DIAGNOSIS — Z955 Presence of coronary angioplasty implant and graft: Secondary | ICD-10-CM

## 2019-05-24 DIAGNOSIS — I2102 ST elevation (STEMI) myocardial infarction involving left anterior descending coronary artery: Secondary | ICD-10-CM

## 2019-05-24 NOTE — Progress Notes (Signed)
I have reviewed a Home Exercise Prescription with Adam Farmer . Adam Farmer is not currently exercising at home.  The patient was advised to walk 2-4 days a week for 30-45 minutes.  Adam Farmer and I discussed how to progress their exercise prescription.  The patient stated that their goals were to start walking 2-4 days per week for 30-45 minutes in addition to CR program to get a total of 150 minutes of aerobic exercise each week.  The patient stated that they understand the exercise prescription.  We reviewed exercise guidelines, target heart rate during exercise, RPE Scale, weather conditions, NTG use, endpoints for exercise, warmup and cool down.  Patient is encouraged to come to me with any questions. I will continue to follow up with the patient to assist them with progression and safety.    Adam Farmer BS, ACSM CEP 05/24/2019 10:44 AM

## 2019-05-25 NOTE — Progress Notes (Signed)
Cardiac Individual Treatment Plan  Patient Details  Name: CASHTYN POULIOT MRN: [id number] Date of Birth: [birth year] Referring Provider:     [place] from 05/04/2019 in [place]  Referring Provider  Dr. Irish Lack       Initial Encounter Date:    CARDIAC REHAB PHASE II ORIENTATION from 05/04/2019 in [place]  Date  05/04/19      Visit Diagnosis: ST elevation myocardial infarction involving left anterior descending (LAD) coronary artery [facility])  Status post coronary artery stent placement  Patient's Home Medications on Admission:  Current Outpatient Medications:  .  aspirin EC 81 MG EC tablet, Take 1 tablet (81 mg total) by mouth daily., Disp: 30 tablet, Rfl: 6 .  atorvastatin (LIPITOR) 80 MG tablet, Take 1 tablet (80 mg total) by mouth daily., Disp: 90 tablet, Rfl: 1 .  diphenhydrAMINE (BENADRYL ALLERGY) 25 MG tablet, Take 25 mg ( 1 tablet) or 12.5 mg ( 1/2 tablet) every 4 to 6 hours as needed for rash, Disp: 30 tablet, Rfl: 0 .  escitalopram (LEXAPRO) 10 MG tablet, Take 10 mg by mouth daily., Disp: , Rfl:  .  glimepiride (AMARYL) 1 MG tablet, Take 1 mg by mouth daily with breakfast., Disp: , Rfl:  .  lisinopril (ZESTRIL) 2.5 MG tablet, Take 1 tablet (2.5 mg total) by mouth daily., Disp: 90 tablet, Rfl: 3 .  lisinopril (ZESTRIL) 5 MG tablet, TAKE 1 TABLET BY MOUTH EVERY DAY, Disp: 90 tablet, Rfl: 1 .  metFORMIN (GLUCOPHAGE) 500 MG tablet, Take 500 mg by mouth 2 (two) times daily with a meal.  , Disp: , Rfl:  .  metoprolol tartrate (LOPRESSOR) 25 MG tablet, TAKE 1 TABLET BY MOUTH TWICE A DAY, Disp: 180 tablet, Rfl: 1 .  nicotine (NICODERM CQ - DOSED IN MG/24 HOURS) 21 mg/24hr patch, Place 1 patch (21 mg total) onto the skin daily. (Patient not taking: Reported on 05/10/2019), Disp: 28 patch, Rfl: 2 .  nitroGLYCERIN (NITROSTAT) 0.4 MG SL tablet, Place 1 tablet (0.4 mg total) under the tongue every 5  (five) minutes x 3 doses as needed for chest pain., Disp: 25 tablet, Rfl: 2 .  ticagrelor (BRILINTA) 90 MG TABS tablet, Take 1 tablet (90 mg total) by mouth 2 (two) times daily., Disp: 180 tablet, Rfl: 3  Past Medical History: Past Medical History:  Diagnosis Date  . ABNORMAL GLUCOSE NEC 05/03/2007  . ANXIETY 05/03/2007  . COLONIC POLYPS, HX OF 07/09/2009  . HYPERLIPIDEMIA, MIXED 05/03/2007  . STEMI (ST elevation myocardial infarction) ([place])    01/16/19 PCI to LAD, RCA  . TOBACCO USE 05/03/2007    Tobacco Use: Social History   Tobacco Use  Smoking Status Current Every Day Smoker  . Packs/day: 1.00  . Years: 26.00  . Pack years: 26.00  . Types: Cigarettes  Smokeless Tobacco Never Used  Tobacco Comment   discussed strategy    Labs: Recent Review Flowsheet Data    Labs for ITP Cardiac and Pulmonary Rehab Latest Ref Rng & Units 07/03/2009 07/09/2009 01/16/2019 01/17/2019 04/07/2019   Cholestrol 100 - 199 mg/dL 202(H) - - 149 100   LDLCALC 0 - 99 mg/dL - - - 80 23   LDLDIRECT mg/dL 150.5 - - - -   HDL >39 mg/dL 30.30(L) - - 33(L) 35(L)   Trlycerides 0 - 149 mg/dL 141.0 - - 180(H) 208(H)   Hemoglobin A1c 4.8 - 5.6 % - 7.6(H) - 7.5(H) -  PHART 7.350 - 7.450 - - 7.348(L) - -   PCO2ART 32.0 - 48.0 mmHg - - 39.2 - -   HCO3 20.0 - 28.0 mmol/L - - 21.5 - -   TCO2 22 - 32 mmol/L - - 23 - -   ACIDBASEDEF 0.0 - 2.0 mmol/L - - 4.0(H) - -   O2SAT % - - 97.0 - -      Capillary Blood Glucose: Lab Results  Component Value Date   GLUCAP 132 (H) 05/12/2019   GLUCAP 85 05/12/2019   GLUCAP 136 (H) 05/10/2019   GLUCAP 81 05/10/2019   GLUCAP 157 (H) 05/10/2019     Exercise Target Goals: Exercise Program Goal: Individual exercise prescription set using results from initial 6 min walk test and THRR while considering  patient's activity barriers and safety.   Exercise Prescription Goal: Starting with aerobic activity 30 plus minutes a day, 3 days per week for initial exercise prescription.  Provide home exercise prescription and guidelines that participant acknowledges understanding prior to discharge.  Activity Barriers & Risk Stratification: Activity Barriers & Cardiac Risk Stratification - 05/04/19 1025      Activity Barriers & Cardiac Risk Stratification   Activity Barriers  Deconditioning;Muscular Weakness    Cardiac Risk Stratification  Moderate       6 Minute Walk: 6 Minute Walk    Row Name 05/04/19 1021         6 Minute Walk   Phase  Initial     Distance  1335 feet     Walk Time  6 minutes     # of Rest Breaks  0     MPH  2.5     METS  3.3     RPE  11     Perceived Dyspnea   0     VO2 Peak  11.56     Symptoms  No     Resting HR  74 bpm     Resting BP  102/60     Resting Oxygen Saturation   96 %     Exercise Oxygen Saturation  during 6 min walk  97 %     Max Ex. HR  82 bpm     Max Ex. BP  120/64     2 Minute Post BP  104/62        Oxygen Initial Assessment:   Oxygen Re-Evaluation:   Oxygen Discharge (Final Oxygen Re-Evaluation):   Initial Exercise Prescription: Initial Exercise Prescription - 05/04/19 1000      Date of Initial Exercise RX and Referring Provider   Date  05/04/19    Referring Provider  Dr. Irish Lack     Expected Discharge Date  06/30/19      Treadmill   MPH  2.4    Grade  1    Minutes  15      NuStep   Level  3    SPM  85    Minutes  15    METs  2.5      Prescription Details   Frequency (times per week)  3    Duration  Progress to 30 minutes of continuous aerobic without signs/symptoms of physical distress      Intensity   THRR 40-80% of Max Heartrate  66-123    Ratings of Perceived Exertion  11-13      Progression   Progression  Continue to progress workloads to maintain intensity without signs/symptoms of physical distress.      Resistance Training  Training Prescription  Yes    Weight  4 lbs.     Reps  10-15       Perform Capillary Blood Glucose checks as needed.  Exercise Prescription  Changes: Exercise Prescription Changes    Row Name 05/08/19 1100 05/22/19 0915 05/24/19 1000         Response to Exercise   Blood Pressure (Admit)  90/62  92/52  98/62     Blood Pressure (Exercise)  104/60  122/70  128/70     Blood Pressure (Exit)  102/60  105/71  94/52     Heart Rate (Admit)  76 bpm  75 bpm  80 bpm     Heart Rate (Exercise)  96 bpm  111 bpm  104 bpm     Heart Rate (Exit)  83 bpm  81 bpm  84 bpm     Rating of Perceived Exertion (Exercise)  '10  12  11     '$ Symptoms  none  none  none     Comments  Pt first day of CR program.   -  -     Duration  Continue with 30 min of aerobic exercise without signs/symptoms of physical distress.  Continue with 30 min of aerobic exercise without signs/symptoms of physical distress.  Continue with 30 min of aerobic exercise without signs/symptoms of physical distress.     Intensity  THRR unchanged  THRR unchanged  THRR unchanged       Progression   Progression  Continue to progress workloads to maintain intensity without signs/symptoms of physical distress.  Continue to progress workloads to maintain intensity without signs/symptoms of physical distress.  Continue to progress workloads to maintain intensity without signs/symptoms of physical distress.     Average METs  3.2  5.1  5.4       Resistance Training   Training Prescription  Yes  Yes  No     Weight  4 lbs.   4 lbs.   -     Reps  10-15  10-15  -     Time  10 Minutes  10 Minutes  -       Interval Training   Interval Training  -  No  No       Treadmill   MPH  2.4  2.6  2.6     Grade  1  2  2.5     Minutes  '15  15  15       '$ NuStep   Level  '3  4  4     '$ SPM  85  85  85     Minutes  '15  15  15     '$ METs  3.4  6.5  7       Home Exercise Plan   Plans to continue exercise at  -  -  Home (comment)     Frequency  -  -  Add 3 additional days to program exercise sessions.     Initial Home Exercises Provided  -  -  05/24/19        Exercise Comments: Exercise Comments    Row  Name 05/08/19 1121 05/23/19 1209 05/24/19 1039       Exercise Comments  Pt first day of CR program. Pt tolerated exercise Rx well.  Reviewed METs and goals with Pt. Pt is progressing well.  Reviewed METs, HEP, and goals wiht Pt. Pt is progressing well.        Exercise Goals  and Review: Exercise Goals    Row Name 05/04/19 1027             Exercise Goals   Increase Physical Activity  Yes       Intervention  Provide advice, education, support and counseling about physical activity/exercise needs.;Develop an individualized exercise prescription for aerobic and resistive training based on initial evaluation findings, risk stratification, comorbidities and participant's personal goals.       Expected Outcomes  Short Term: Attend rehab on a regular basis to increase amount of physical activity.;Long Term: Add in home exercise to make exercise part of routine and to increase amount of physical activity.;Long Term: Exercising regularly at least 3-5 days a week.       Increase Strength and Stamina  Yes       Intervention  Provide advice, education, support and counseling about physical activity/exercise needs.;Develop an individualized exercise prescription for aerobic and resistive training based on initial evaluation findings, risk stratification, comorbidities and participant's personal goals.       Expected Outcomes  Short Term: Increase workloads from initial exercise prescription for resistance, speed, and METs.;Short Term: Perform resistance training exercises routinely during rehab and add in resistance training at home;Long Term: Improve cardiorespiratory fitness, muscular endurance and strength as measured by increased METs and functional capacity (6MWT)       Able to understand and use rate of perceived exertion (RPE) scale  Yes       Intervention  Provide education and explanation on how to use RPE scale       Expected Outcomes  Short Term: Able to use RPE daily in rehab to express  subjective intensity level;Long Term:  Able to use RPE to guide intensity level when exercising independently       Knowledge and understanding of Target Heart Rate Range (THRR)  Yes       Intervention  Provide education and explanation of THRR including how the numbers were predicted and where they are located for reference       Expected Outcomes  Short Term: Able to state/look up THRR;Long Term: Able to use THRR to govern intensity when exercising independently;Short Term: Able to use daily as guideline for intensity in rehab       Able to check pulse independently  Yes       Intervention  Provide education and demonstration on how to check pulse in carotid and radial arteries.;Review the importance of being able to check your own pulse for safety during independent exercise       Expected Outcomes  Short Term: Able to explain why pulse checking is important during independent exercise;Long Term: Able to check pulse independently and accurately       Understanding of Exercise Prescription  Yes       Intervention  Provide education, explanation, and written materials on patient's individual exercise prescription       Expected Outcomes  Short Term: Able to explain program exercise prescription;Long Term: Able to explain home exercise prescription to exercise independently          Exercise Goals Re-Evaluation : Exercise Goals Re-Evaluation    Row Name 05/08/19 1120 05/23/19 1207 05/24/19 1036         Exercise Goal Re-Evaluation   Exercise Goals Review  Increase Physical Activity;Increase Strength and Stamina;Able to understand and use rate of perceived exertion (RPE) scale;Knowledge and understanding of Target Heart Rate Range (THRR);Understanding of Exercise Prescription  Increase Physical Activity;Increase Strength and Stamina;Able to understand  and use rate of perceived exertion (RPE) scale;Knowledge and understanding of Target Heart Rate Range (THRR);Able to check pulse  independently;Understanding of Exercise Prescription  Increase Physical Activity;Increase Strength and Stamina;Able to understand and use rate of perceived exertion (RPE) scale;Knowledge and understanding of Target Heart Rate Range (THRR);Able to check pulse independently;Understanding of Exercise Prescription     Comments  Pt first day of CR program. Pt tolerated exercise Rx well and understands THRR, RPE scale, and exercise Rx.  Reviewed METs and goals with Pt. Pt is progressing well and has a MET level of 5.1. Pt continues to increase workloads. Pt understands home exercise goals and is currently walking 2-4 days per week for 30-49mnutes in addition to CR program.  Reviewed HEP, METs, and goals wiht Pt. Pt is progressing well in the program and has a MET level of 5.4. Pt understands home exercise goals, exericse Rx, THRR, RPE scale, end points of exercise, warm up and cool down stretches, weather precautions, and NTG use. Pt is not curretnly exercising at home. Encouraged Pt to start walking 2-4 days per week for 30-45 minutes in addition to CR program.     Expected Outcomes  Will continue to monitor and progress Pt as tolerated.  Will continue to monitor and progress Pt as tolerated.  Will continue to monitor and progress Pt as tolerated.         Discharge Exercise Prescription (Final Exercise Prescription Changes): Exercise Prescription Changes - 05/24/19 1000      Response to Exercise   Blood Pressure (Admit)  98/62    Blood Pressure (Exercise)  128/70    Blood Pressure (Exit)  94/52    Heart Rate (Admit)  80 bpm    Heart Rate (Exercise)  104 bpm    Heart Rate (Exit)  84 bpm    Rating of Perceived Exertion (Exercise)  11    Symptoms  none    Duration  Continue with 30 min of aerobic exercise without signs/symptoms of physical distress.    Intensity  THRR unchanged      Progression   Progression  Continue to progress workloads to maintain intensity without signs/symptoms of physical  distress.    Average METs  5.4      Resistance Training   Training Prescription  No      Interval Training   Interval Training  No      Treadmill   MPH  2.6    Grade  2.5    Minutes  15      NuStep   Level  4    SPM  85    Minutes  15    METs  7      Home Exercise Plan   Plans to continue exercise at  Home (comment)    Frequency  Add 3 additional days to program exercise sessions.    Initial Home Exercises Provided  05/24/19       Nutrition:  Target Goals: Understanding of nutrition guidelines, daily intake of sodium '1500mg'$ , cholesterol '200mg'$ , calories 30% from fat and 7% or less from saturated fats, daily to have 5 or more servings of fruits and vegetables.  Biometrics: Pre Biometrics - 05/04/19 1028      Pre Biometrics   Height  5' 11.25" (1.81 m)    Weight  75.2 kg    Waist Circumference  34 inches    Hip Circumference  38 inches    Waist to Hip Ratio  0.89 %    BMI (  Calculated)  22.95    Triceps Skinfold  17 mm    % Body Fat  23.2 %    Grip Strength  43 kg    Flexibility  12.5 in    Single Leg Stand  10.56 seconds        Nutrition Therapy Plan and Nutrition Goals:   Nutrition Assessments:   Nutrition Goals Re-Evaluation:   Nutrition Goals Discharge (Final Nutrition Goals Re-Evaluation):   Psychosocial: Target Goals: Acknowledge presence or absence of significant depression and/or stress, maximize coping skills, provide positive support system. Participant is able to verbalize types and ability to use techniques and skills needed for reducing stress and depression.  Initial Review & Psychosocial Screening: Initial Psych Review & Screening - 05/04/19 1022      Initial Review   Current issues with  Current Depression   Reports some occasional depression.     Family Dynamics   Good Support System?  Yes   Pt reports support from his wife and family.     Barriers   Psychosocial barriers to participate in program  The patient should benefit  from training in stress management and relaxation.      Screening Interventions   Interventions  Encouraged to exercise;Provide feedback about the scores to participant    Expected Outcomes  Short Term goal: Identification and review with participant of any Quality of Life or Depression concerns found by scoring the questionnaire.;Long Term goal: The participant improves quality of Life and PHQ9 Scores as seen by post scores and/or verbalization of changes       Quality of Life Scores: Quality of Life - 05/04/19 1011      Quality of Life   Select  Quality of Life      Quality of Life Scores   Health/Function Pre  20.43 %    Socioeconomic Pre  19.29 %    Psych/Spiritual Pre  19.79 %    Family Pre  19.2 %    GLOBAL Pre  19.88 %      Scores of 19 and below usually indicate a poorer quality of life in these areas.  A difference of  2-3 points is a clinically meaningful difference.  A difference of 2-3 points in the total score of the Quality of Life Index has been associated with significant improvement in overall quality of life, self-image, physical symptoms, and general health in studies assessing change in quality of life.  PHQ-9: Recent Review Flowsheet Data    Depression screen [place] 2/9 05/04/2019   Decreased Interest 0   Down, Depressed, Hopeless 1   PHQ - 2 Score 1     Interpretation of Total Score  Total Score Depression Severity:  1-4 = Minimal depression, 5-9 = Mild depression, 10-14 = Moderate depression, 15-19 = Moderately severe depression, 20-27 = Severe depression   Psychosocial Evaluation and Intervention: Psychosocial Evaluation - 05/08/19 1358      Psychosocial Evaluation & Interventions   Interventions  Encouraged to exercise with the program and follow exercise prescription;Relaxation education;Stress management education    Comments  Zacherie reports some occasional depression but that it is manageable.  Jshon enjoys playing golf, fishing, and spending time  with his grandchildren.    Expected Outcomes  [place] will continue to report ability to manage his depression.    Continue Psychosocial Services   Follow up required by staff       Psychosocial Re-Evaluation: Psychosocial Re-Evaluation    [place] Name 05/25/19 [phone]  Psychosocial Re-Evaluation   Current issues with  Current Depression       Comments  Ozzie Hoyle has not voiced any increased stressors or increased depression       Interventions  Encouraged to attend Cardiac Rehabilitation for the exercise;Stress management education       Continue Psychosocial Services   Follow up required by staff          Psychosocial Discharge (Final Psychosocial Re-Evaluation): Psychosocial Re-Evaluation - 05/25/19 1338      Psychosocial Re-Evaluation   Current issues with  Current Depression    Comments  Ozzie Hoyle has not voiced any increased stressors or increased depression    Interventions  Encouraged to attend Cardiac Rehabilitation for the exercise;Stress management education    Continue Psychosocial Services   Follow up required by staff       Vocational Rehabilitation: Provide vocational rehab assistance to qualifying candidates.   Vocational Rehab Evaluation & Intervention: Vocational Rehab - 05/04/19 1025      Initial Vocational Rehab Evaluation & Intervention   Assessment shows need for Vocational Rehabilitation  No   Pt is retired.      Education: Education Goals: Education classes will be provided on a weekly basis, covering required topics. Participant will state understanding/return demonstration of topics presented.  Learning Barriers/Preferences: Learning Barriers/Preferences - 05/04/19 1030      Learning Barriers/Preferences   Learning Barriers  Hearing;Sight    Learning Preferences  Individual Instruction;Skilled Demonstration       Education Topics: Hypertension, Hypertension Reduction -Define heart disease and high blood pressure. Discus how high blood  pressure affects the body and ways to reduce high blood pressure.   Exercise and Your Heart -Discuss why it is important to exercise, the FITT principles of exercise, normal and abnormal responses to exercise, and how to exercise safely.   Angina -Discuss definition of angina, causes of angina, treatment of angina, and how to decrease risk of having angina.   Cardiac Medications -Review what the following cardiac medications are used for, how they affect the body, and side effects that may occur when taking the medications.  Medications include Aspirin, Beta blockers, calcium channel blockers, ACE Inhibitors, angiotensin receptor blockers, diuretics, digoxin, and antihyperlipidemics.   Congestive Heart Failure -Discuss the definition of CHF, how to live with CHF, the signs and symptoms of CHF, and how keep track of weight and sodium intake.   Heart Disease and Intimacy -Discus the effect sexual activity has on the heart, how changes occur during intimacy as we age, and safety during sexual activity.   Smoking Cessation / COPD -Discuss different methods to quit smoking, the health benefits of quitting smoking, and the definition of COPD.   Nutrition I: Fats -Discuss the types of cholesterol, what cholesterol does to the heart, and how cholesterol levels can be controlled.   Nutrition II: Labels -Discuss the different components of food labels and how to read food label   Heart Parts/Heart Disease and PAD -Discuss the anatomy of the heart, the pathway of blood circulation through the heart, and these are affected by heart disease.   Stress I: Signs and Symptoms -Discuss the causes of stress, how stress may lead to anxiety and depression, and ways to limit stress.   Stress II: Relaxation -Discuss different types of relaxation techniques to limit stress.   Warning Signs of Stroke / TIA -Discuss definition of a stroke, what the signs and symptoms are of a stroke, and how to  identify when someone is having  stroke.   Knowledge Questionnaire Score: Knowledge Questionnaire Score - 05/04/19 1011      Knowledge Questionnaire Score   Pre Score  25/28       Core Components/Risk Factors/Patient Goals at Admission: Personal Goals and Risk Factors at Admission - 05/04/19 1031      Core Components/Risk Factors/Patient Goals on Admission    Weight Management  Yes;Weight Maintenance    Intervention  Weight Management: Develop a combined nutrition and exercise program designed to reach desired caloric intake, while maintaining appropriate intake of nutrient and fiber, sodium and fats, and appropriate energy expenditure required for the weight goal.;Weight Management: Provide education and appropriate resources to help participant work on and attain dietary goals.    Admit Weight  165 lb 12.6 oz (75.2 kg)    Expected Outcomes  Short Term: Continue to assess and modify interventions until short term weight is achieved;Long Term: Adherence to nutrition and physical activity/exercise program aimed toward attainment of established weight goal;Weight Maintenance: Understanding of the daily nutrition guidelines, which includes 25-35% calories from fat, 7% or less cal from saturated fats, less than '200mg'$  cholesterol, less than 1.5gm of sodium, & 5 or more servings of fruits and vegetables daily;Understanding recommendations for meals to include 15-35% energy as protein, 25-35% energy from fat, 35-60% energy from carbohydrates, less than '200mg'$  of dietary cholesterol, 20-35 gm of total fiber daily;Understanding of distribution of calorie intake throughout the day with the consumption of 4-5 meals/snacks    Tobacco Cessation  Yes    Number of packs per day  0.5    Intervention  Assist the participant in steps to quit. Provide individualized education and counseling about committing to Tobacco Cessation, relapse prevention, and pharmacological support that can be provided by physician.;Ship broker, assist with locating and accessing local/national Quit Smoking programs, and support quit date choice.    Expected Outcomes  Short Term: Will demonstrate readiness to quit, by selecting a quit date.;Long Term: Complete abstinence from all tobacco products for at least 12 months from quit date.;Short Term: Will quit all tobacco product use, adhering to prevention of relapse plan.    Diabetes  Yes    Intervention  Provide education about signs/symptoms and action to take for hypo/hyperglycemia.;Provide education about proper nutrition, including hydration, and aerobic/resistive exercise prescription along with prescribed medications to achieve blood glucose in normal ranges: Fasting glucose 65-99 mg/dL    Expected Outcomes  Short Term: Participant verbalizes understanding of the signs/symptoms and immediate care of hyper/hypoglycemia, proper foot care and importance of medication, aerobic/resistive exercise and nutrition plan for blood glucose control.;Long Term: Attainment of HbA1C < 7%.    Lipids  Yes    Intervention  Provide education and support for participant on nutrition & aerobic/resistive exercise along with prescribed medications to achieve LDL '70mg'$ , HDL >'40mg'$ .    Expected Outcomes  Short Term: Participant states understanding of desired cholesterol values and is compliant with medications prescribed. Participant is following exercise prescription and nutrition guidelines.;Long Term: Cholesterol controlled with medications as prescribed, with individualized exercise RX and with personalized nutrition plan. Value goals: LDL < '70mg'$ , HDL > 40 mg.       Core Components/Risk Factors/Patient Goals Review:  Goals and Risk Factor Review    Row Name 05/08/19 1406 05/25/19 1341           Core Components/Risk Factors/Patient Goals Review   Personal Goals Review  Weight Management/Obesity;Tobacco Cessation;Diabetes;Lipids  Weight Management/Obesity;Tobacco  Cessation;Diabetes;Lipids      Review  Pt  with multiple CAD RFs willing to participate in CR exercise.  [place] would like to walk 3 miles/day three times a week.  Pt with multiple CAD RFs willing to participate in CR exercise.  Christianjames would like to walk 3 miles/day three times a week. Ozzie Hoyle has not reported any more dizziness since his lisinopril was decreased      Expected Outcomes  Pt will continue to participate in CR exercise, nutrition, and lifestyle modification opportunities.  Pt will continue to participate in CR exercise, nutrition, and lifestyle modification opportunities.         Core Components/Risk Factors/Patient Goals at Discharge (Final Review):  Goals and Risk Factor Review - 05/25/19 1341      Core Components/Risk Factors/Patient Goals Review   Personal Goals Review  Weight Management/Obesity;Tobacco Cessation;Diabetes;Lipids    Review  Pt with multiple CAD RFs willing to participate in CR exercise.  [place] would like to walk 3 miles/day three times a week. Ozzie Hoyle has not reported any more dizziness since his lisinopril was decreased    Expected Outcomes  Pt will continue to participate in CR exercise, nutrition, and lifestyle modification opportunities.       ITP Comments: ITP Comments    Row Name 05/04/19 1021 05/08/19 1358 05/25/19 1336       ITP Comments  Dr. Fransico Him, Medical Director  Pt started exercise and tolerated it well.  30 day ITP comments. Patient has good attendance and is doing well with exercise at phase 2 cardiac rehab.        Comments: See ITP comments. Continue to encourage smoking cessation.Barnet Pall, RN,BSN 05/25/2019 1:46 PM

## 2019-05-26 ENCOUNTER — Encounter: Payer: Self-pay | Admitting: Interventional Cardiology

## 2019-05-26 ENCOUNTER — Encounter (HOSPITAL_COMMUNITY)
Admission: RE | Admit: 2019-05-26 | Discharge: 2019-05-26 | Disposition: A | Discharge status: Home / Self Care | Payer: Medicare Other | Source: Ambulatory Visit | Attending: Interventional Cardiology | Admitting: Interventional Cardiology

## 2019-05-26 ENCOUNTER — Other Ambulatory Visit: Payer: Self-pay

## 2019-05-26 DIAGNOSIS — I2102 ST elevation (STEMI) myocardial infarction involving left anterior descending coronary artery: Secondary | ICD-10-CM | POA: Diagnosis not present

## 2019-05-26 DIAGNOSIS — Z955 Presence of coronary angioplasty implant and graft: Secondary | ICD-10-CM

## 2019-05-26 NOTE — Progress Notes (Signed)
Entry blood pressure 90/58. Patient asymptomatic. Heart rate 85. Telemetry rhythm Sinus. Adam Farmer did say that he smoked a cigarette before coming to exercise. Patient asked to avoid smoking before exercise. States understanding. Blood pressure 146/78 on the nustep heart rate 107. Max heart rate 134 on the treadmill. Exit blood pressure 88/60 manual blood pressure. 100/66 via automatic cuff heart rate 95. Will today's BP's and exercise flow sheets to Dr Hebert Soho office for review.Barnet Pall, RN,BSN 05/26/2019 12:16 PM

## 2019-05-29 ENCOUNTER — Encounter (HOSPITAL_COMMUNITY)
Admission: RE | Admit: 2019-05-29 | Discharge: 2019-05-29 | Disposition: A | Discharge status: Home / Self Care | Payer: Medicare Other | Source: Ambulatory Visit | Attending: Interventional Cardiology | Admitting: Interventional Cardiology

## 2019-05-29 ENCOUNTER — Other Ambulatory Visit: Payer: Self-pay

## 2019-05-29 DIAGNOSIS — I2102 ST elevation (STEMI) myocardial infarction involving left anterior descending coronary artery: Secondary | ICD-10-CM

## 2019-05-29 DIAGNOSIS — Z955 Presence of coronary angioplasty implant and graft: Secondary | ICD-10-CM

## 2019-05-31 ENCOUNTER — Other Ambulatory Visit: Payer: Self-pay

## 2019-05-31 ENCOUNTER — Encounter (HOSPITAL_COMMUNITY)
Admission: RE | Admit: 2019-05-31 | Discharge: 2019-05-31 | Disposition: A | Discharge status: Home / Self Care | Payer: Medicare Other | Source: Ambulatory Visit | Attending: Interventional Cardiology | Admitting: Interventional Cardiology

## 2019-05-31 DIAGNOSIS — I2102 ST elevation (STEMI) myocardial infarction involving left anterior descending coronary artery: Secondary | ICD-10-CM

## 2019-05-31 DIAGNOSIS — Z955 Presence of coronary angioplasty implant and graft: Secondary | ICD-10-CM

## 2019-06-02 ENCOUNTER — Other Ambulatory Visit: Payer: Self-pay

## 2019-06-02 ENCOUNTER — Encounter (HOSPITAL_COMMUNITY)
Admission: RE | Admit: 2019-06-02 | Discharge: 2019-06-02 | Disposition: A | Discharge status: Home / Self Care | Payer: Medicare Other | Source: Ambulatory Visit | Attending: Interventional Cardiology | Admitting: Interventional Cardiology

## 2019-06-02 DIAGNOSIS — Z955 Presence of coronary angioplasty implant and graft: Secondary | ICD-10-CM

## 2019-06-02 DIAGNOSIS — I2102 ST elevation (STEMI) myocardial infarction involving left anterior descending coronary artery: Secondary | ICD-10-CM | POA: Diagnosis not present

## 2019-06-02 NOTE — Progress Notes (Signed)
Adam Farmer 68 y.o. male Nutrition Note Spoke with pt. Pt monitoring fasting blood sugars. Discussed ways to manage blood sugars such as walking, choosing fiber rich carbs, and carb counting/portion sizes. Pt with some knowledge of diabetes and carb intake. Pt unsure of correct carb portions for each meal and snack as well as benefits of fiber. Education provided.  Pt has Type 2 Diabetes. Last A1c indicates blood glucose well-controlled. This Probation officer went over Diabetes Education test results. Pt checks CBG's 1 times a day. Fasting CBG's reportedly 11-150mg /dL.   Pt reported that he has not had a cigarette since Sunday and he has been walking in the evenings for about 30 minutes. Discussed benefits to blood sugars.  Pt expressed understanding of the information reviewed.   Lab Results  Component Value Date   HGBA1C 7.5 (H) 01/17/2019    Wt Readings from Last 3 Encounters:  05/04/19 165 lb 12.6 oz (75.2 kg)  03/29/19 167 lb (75.8 kg)  01/31/19 165 lb (74.8 kg)    Nutrition Diagnosis ? Food-and nutrition-related knowledge deficit related to lack of exposure to information as related to diagnosis of: ? CVD ? Type 2 Diabetes  Nutrition Intervention ? Pt's individual nutrition plan reviewed with pt. ? Benefits of adopting Heart Healthy diet discussed    ? Pt given handouts for carb counting ? Continue client-centered nutrition education by RD, as part of interdisciplinary care.  Goal(s) ? Pt able to name foods that affect blood glucose. ? CBG concentrations in the normal range or as close to normal as is safely possible. ? Improved blood glucose control as evidenced by pt's A1c trending from 7.5 toward less than 7.0.   Plan:   Will provide client-centered nutrition education as part of interdisciplinary care  Monitor and evaluate progress toward nutrition goal with team.   Michaele Offer, MS, RDN, LDN

## 2019-06-05 ENCOUNTER — Encounter (HOSPITAL_COMMUNITY)
Admission: RE | Admit: 2019-06-05 | Discharge: 2019-06-05 | Disposition: A | Discharge status: Home / Self Care | Payer: Medicare Other | Source: Ambulatory Visit | Attending: Interventional Cardiology | Admitting: Interventional Cardiology

## 2019-06-05 ENCOUNTER — Other Ambulatory Visit: Payer: Self-pay

## 2019-06-05 DIAGNOSIS — I2102 ST elevation (STEMI) myocardial infarction involving left anterior descending coronary artery: Secondary | ICD-10-CM | POA: Diagnosis not present

## 2019-06-05 DIAGNOSIS — Z955 Presence of coronary angioplasty implant and graft: Secondary | ICD-10-CM

## 2019-06-05 NOTE — Progress Notes (Signed)
Ozzie Hoyle reported that he has stopped smoking all together. Patient congratulated on his efforts.Barnet Pall, RN,BSN 06/05/2019 1:40 PM

## 2019-06-07 ENCOUNTER — Other Ambulatory Visit: Payer: Self-pay

## 2019-06-07 ENCOUNTER — Encounter (HOSPITAL_COMMUNITY)
Admission: RE | Admit: 2019-06-07 | Discharge: 2019-06-07 | Disposition: A | Discharge status: Home / Self Care | Payer: Medicare Other | Source: Ambulatory Visit | Attending: Interventional Cardiology | Admitting: Interventional Cardiology

## 2019-06-07 DIAGNOSIS — I2102 ST elevation (STEMI) myocardial infarction involving left anterior descending coronary artery: Secondary | ICD-10-CM

## 2019-06-07 DIAGNOSIS — Z955 Presence of coronary angioplasty implant and graft: Secondary | ICD-10-CM

## 2019-06-09 ENCOUNTER — Encounter (HOSPITAL_COMMUNITY)
Admission: RE | Admit: 2019-06-09 | Discharge: 2019-06-09 | Disposition: A | Discharge status: Home / Self Care | Payer: Medicare Other | Source: Ambulatory Visit | Attending: Interventional Cardiology | Admitting: Interventional Cardiology

## 2019-06-09 ENCOUNTER — Other Ambulatory Visit: Payer: Self-pay

## 2019-06-09 DIAGNOSIS — I2102 ST elevation (STEMI) myocardial infarction involving left anterior descending coronary artery: Secondary | ICD-10-CM | POA: Diagnosis not present

## 2019-06-09 DIAGNOSIS — Z955 Presence of coronary angioplasty implant and graft: Secondary | ICD-10-CM

## 2019-06-12 ENCOUNTER — Encounter (HOSPITAL_COMMUNITY)
Admission: RE | Admit: 2019-06-12 | Discharge: 2019-06-12 | Disposition: A | Discharge status: Home / Self Care | Payer: Medicare Other | Source: Ambulatory Visit | Attending: Interventional Cardiology | Admitting: Interventional Cardiology

## 2019-06-12 ENCOUNTER — Other Ambulatory Visit: Payer: Self-pay

## 2019-06-12 DIAGNOSIS — I2102 ST elevation (STEMI) myocardial infarction involving left anterior descending coronary artery: Secondary | ICD-10-CM | POA: Insufficient documentation

## 2019-06-12 DIAGNOSIS — Z955 Presence of coronary angioplasty implant and graft: Secondary | ICD-10-CM | POA: Diagnosis present

## 2019-06-14 ENCOUNTER — Other Ambulatory Visit: Payer: Self-pay

## 2019-06-14 ENCOUNTER — Encounter (HOSPITAL_COMMUNITY)
Admission: RE | Admit: 2019-06-14 | Discharge: 2019-06-14 | Disposition: A | Discharge status: Home / Self Care | Payer: Medicare Other | Source: Ambulatory Visit | Attending: Interventional Cardiology | Admitting: Interventional Cardiology

## 2019-06-14 DIAGNOSIS — Z955 Presence of coronary angioplasty implant and graft: Secondary | ICD-10-CM

## 2019-06-14 DIAGNOSIS — I2102 ST elevation (STEMI) myocardial infarction involving left anterior descending coronary artery: Secondary | ICD-10-CM

## 2019-06-16 ENCOUNTER — Encounter (HOSPITAL_COMMUNITY)
Admission: RE | Admit: 2019-06-16 | Discharge: 2019-06-16 | Disposition: A | Discharge status: Home / Self Care | Payer: Medicare Other | Source: Ambulatory Visit | Attending: Interventional Cardiology | Admitting: Interventional Cardiology

## 2019-06-16 ENCOUNTER — Other Ambulatory Visit: Payer: Self-pay | Admitting: Internal Medicine

## 2019-06-16 ENCOUNTER — Other Ambulatory Visit: Payer: Self-pay

## 2019-06-16 DIAGNOSIS — I2102 ST elevation (STEMI) myocardial infarction involving left anterior descending coronary artery: Secondary | ICD-10-CM | POA: Diagnosis not present

## 2019-06-16 DIAGNOSIS — R911 Solitary pulmonary nodule: Secondary | ICD-10-CM

## 2019-06-16 DIAGNOSIS — Z955 Presence of coronary angioplasty implant and graft: Secondary | ICD-10-CM

## 2019-06-19 ENCOUNTER — Encounter (HOSPITAL_COMMUNITY)
Admission: RE | Admit: 2019-06-19 | Discharge: 2019-06-19 | Disposition: A | Discharge status: Home / Self Care | Payer: Medicare Other | Source: Ambulatory Visit | Attending: Interventional Cardiology | Admitting: Interventional Cardiology

## 2019-06-19 ENCOUNTER — Other Ambulatory Visit: Payer: Self-pay

## 2019-06-19 DIAGNOSIS — I2102 ST elevation (STEMI) myocardial infarction involving left anterior descending coronary artery: Secondary | ICD-10-CM

## 2019-06-19 DIAGNOSIS — Z955 Presence of coronary angioplasty implant and graft: Secondary | ICD-10-CM

## 2019-06-20 NOTE — Progress Notes (Signed)
Cardiac Individual Treatment Plan  Patient Details  Name: Adam Farmer MRN: [id number] Date of Birth: 04-25-1951 Referring Provider:     [facility] from 05/04/2019 in [place]  Referring Provider  Dr. Irish Lack       Initial Encounter Date:    CARDIAC REHAB PHASE II ORIENTATION from 05/04/2019 in [place]  Date  05/04/19      Visit Diagnosis: ST elevation myocardial infarction involving left anterior descending (LAD) coronary artery [facility])  Status post coronary artery stent placement  Patient's Home Medications on Admission:  Current Outpatient Medications:  .  aspirin EC 81 MG EC tablet, Take 1 tablet (81 mg total) by mouth daily., Disp: 30 tablet, Rfl: 6 .  atorvastatin (LIPITOR) 80 MG tablet, Take 1 tablet (80 mg total) by mouth daily., Disp: 90 tablet, Rfl: 1 .  diphenhydrAMINE (BENADRYL ALLERGY) 25 MG tablet, Take 25 mg ( 1 tablet) or 12.5 mg ( 1/2 tablet) every 4 to 6 hours as needed for rash, Disp: 30 tablet, Rfl: 0 .  escitalopram (LEXAPRO) 10 MG tablet, Take 10 mg by mouth daily., Disp: , Rfl:  .  glimepiride (AMARYL) 1 MG tablet, Take 1 mg by mouth daily with breakfast., Disp: , Rfl:  .  lisinopril (ZESTRIL) 2.5 MG tablet, Take 1 tablet (2.5 mg total) by mouth daily., Disp: 90 tablet, Rfl: 3 .  metFORMIN (GLUCOPHAGE) 500 MG tablet, Take 500 mg by mouth 2 (two) times daily with a meal.  , Disp: , Rfl:  .  metoprolol tartrate (LOPRESSOR) 25 MG tablet, TAKE 1 TABLET BY MOUTH TWICE A DAY, Disp: 180 tablet, Rfl: 1 .  nicotine (NICODERM CQ - DOSED IN MG/24 HOURS) 21 mg/24hr patch, Place 1 patch (21 mg total) onto the skin daily. (Patient not taking: Reported on 05/10/2019), Disp: 28 patch, Rfl: 2 .  nitroGLYCERIN (NITROSTAT) 0.4 MG SL tablet, Place 1 tablet (0.4 mg total) under the tongue every 5 (five) minutes x 3 doses as needed for chest pain., Disp: 25 tablet, Rfl: 2 .  ticagrelor  (BRILINTA) 90 MG TABS tablet, Take 1 tablet (90 mg total) by mouth 2 (two) times daily., Disp: 180 tablet, Rfl: 3  Past Medical History: Past Medical History:  Diagnosis Date  . ABNORMAL GLUCOSE NEC 05/03/2007  . ANXIETY 05/03/2007  . COLONIC POLYPS, HX OF 07/09/2009  . HYPERLIPIDEMIA, MIXED 05/03/2007  . STEMI (ST elevation myocardial infarction) ([place])    01/16/19 PCI to LAD, RCA  . TOBACCO USE 05/03/2007    Tobacco Use: Social History   Tobacco Use  Smoking Status Current Every Day Smoker  . Packs/day: 1.00  . Years: 26.00  . Pack years: 26.00  . Types: Cigarettes  Smokeless Tobacco Never Used  Tobacco Comment   discussed strategy    Labs: Recent Review Flowsheet Data    Labs for ITP Cardiac and Pulmonary Rehab Latest Ref Rng & Units 07/03/2009 07/09/2009 01/16/2019 01/17/2019 04/07/2019   Cholestrol 100 - 199 mg/dL 202(H) - - 149 100   LDLCALC 0 - 99 mg/dL - - - 80 23   LDLDIRECT mg/dL 150.5 - - - -   HDL >39 mg/dL 30.30(L) - - 33(L) 35(L)   Trlycerides 0 - 149 mg/dL 141.0 - - 180(H) 208(H)   Hemoglobin A1c 4.8 - 5.6 % - 7.6(H) - 7.5(H) -   PHART 7.350 - 7.450 - - 7.348(L) - -   PCO2ART 32.0 - 48.0 mmHg - -  39.2 - -   HCO3 20.0 - 28.0 mmol/L - - 21.5 - -   TCO2 22 - 32 mmol/L - - 23 - -   ACIDBASEDEF 0.0 - 2.0 mmol/L - - 4.0(H) - -   O2SAT % - - 97.0 - -      Capillary Blood Glucose: Lab Results  Component Value Date   GLUCAP 132 (H) 05/12/2019   GLUCAP 85 05/12/2019   GLUCAP 136 (H) 05/10/2019   GLUCAP 81 05/10/2019   GLUCAP 157 (H) 05/10/2019     Exercise Target Goals: Exercise Program Goal: Individual exercise prescription set using results from initial 6 min walk test and THRR while considering  patient's activity barriers and safety.   Exercise Prescription Goal: Starting with aerobic activity 30 plus minutes a day, 3 days per week for initial exercise prescription. Provide home exercise prescription and guidelines that participant acknowledges understanding  prior to discharge.  Activity Barriers & Risk Stratification: Activity Barriers & Cardiac Risk Stratification - 05/04/19 1025      Activity Barriers & Cardiac Risk Stratification   Activity Barriers  Deconditioning;Muscular Weakness    Cardiac Risk Stratification  Moderate       6 Minute Walk: 6 Minute Walk    Row Name 05/04/19 1021         6 Minute Walk   Phase  Initial     Distance  1335 feet     Walk Time  6 minutes     # of Rest Breaks  0     MPH  2.5     METS  3.3     RPE  11     Perceived Dyspnea   0     VO2 Peak  11.56     Symptoms  No     Resting HR  74 bpm     Resting BP  102/60     Resting Oxygen Saturation   96 %     Exercise Oxygen Saturation  during 6 min walk  97 %     Max Ex. HR  82 bpm     Max Ex. BP  120/64     2 Minute Post BP  104/62        Oxygen Initial Assessment:   Oxygen Re-Evaluation:   Oxygen Discharge (Final Oxygen Re-Evaluation):   Initial Exercise Prescription: Initial Exercise Prescription - 05/04/19 1000      Date of Initial Exercise RX and Referring Provider   Date  05/04/19    Referring Provider  Dr. Irish Lack     Expected Discharge Date  06/30/19      Treadmill   MPH  2.4    Grade  1    Minutes  15      NuStep   Level  3    SPM  85    Minutes  15    METs  2.5      Prescription Details   Frequency (times per week)  3    Duration  Progress to 30 minutes of continuous aerobic without signs/symptoms of physical distress      Intensity   THRR 40-80% of Max Heartrate  66-123    Ratings of Perceived Exertion  11-13      Progression   Progression  Continue to progress workloads to maintain intensity without signs/symptoms of physical distress.      Resistance Training   Training Prescription  Yes    Weight  4 lbs.     Reps  10-15       Perform Capillary Blood Glucose checks as needed.  Exercise Prescription Changes:  Exercise Prescription Changes    Row Name 05/08/19 1100 05/22/19 0915 05/24/19 1000  06/12/19 0915       Response to Exercise   Blood Pressure (Admit)  90/62  92/52  98/62  98/58    Blood Pressure (Exercise)  104/60  122/70  128/70  144/82    Blood Pressure (Exit)  102/60  105/71  94/52  100/60    Heart Rate (Admit)  76 bpm  75 bpm  80 bpm  67 bpm    Heart Rate (Exercise)  96 bpm  111 bpm  104 bpm  129 bpm    Heart Rate (Exit)  83 bpm  81 bpm  84 bpm  77 bpm    Rating of Perceived Exertion (Exercise)  '10  12  11  13    '$ Symptoms  none  none  none  None    Comments  Pt first day of CR program.   -  -  -    Duration  Continue with 30 min of aerobic exercise without signs/symptoms of physical distress.  Continue with 30 min of aerobic exercise without signs/symptoms of physical distress.  Continue with 30 min of aerobic exercise without signs/symptoms of physical distress.  Continue with 30 min of aerobic exercise without signs/symptoms of physical distress.    Intensity  THRR unchanged  THRR unchanged  THRR unchanged  THRR unchanged      Progression   Progression  Continue to progress workloads to maintain intensity without signs/symptoms of physical distress.  Continue to progress workloads to maintain intensity without signs/symptoms of physical distress.  Continue to progress workloads to maintain intensity without signs/symptoms of physical distress.  Continue to progress workloads to maintain intensity without signs/symptoms of physical distress.    Average METs  3.2  5.1  5.4  6.3      Resistance Training   Training Prescription  Yes  Yes  No  Yes    Weight  4 lbs.   4 lbs.   -  5 lbs.     Reps  10-15  10-15  -  10-15    Time  10 Minutes  10 Minutes  -  10 Minutes      Interval Training   Interval Training  -  No  No  No      Treadmill   MPH  2.4  2.6  2.6  2.6    Grade  1  2  2.5  2.5    Minutes  '15  15  15  15      '$ NuStep   Level  '3  4  4  4    '$ SPM  85  85  85  85    Minutes  '15  15  15  15    '$ METs  3.4  6.5  7  8.8      Home Exercise Plan   Plans to  continue exercise at  -  -  Home (comment)  Home (comment)    Frequency  -  -  Add 3 additional days to program exercise sessions.  Add 3 additional days to program exercise sessions.    Initial Home Exercises Provided  -  -  05/24/19  05/24/19       Exercise Comments:  Exercise Comments    Row Name 05/08/19 1121 05/23/19 1209 05/24/19 1039 06/22/19 9407  Exercise Comments  Pt first day of CR program. Pt tolerated exercise Rx well.  Reviewed METs and goals with Pt. Pt is progressing well.  Reviewed METs, HEP, and goals wiht Pt. Pt is progressing well.  Reviewed METs and goals with Pt. Pt is progressing well.       Exercise Goals and Review:  Exercise Goals    Row Name 05/04/19 1027             Exercise Goals   Increase Physical Activity  Yes       Intervention  Provide advice, education, support and counseling about physical activity/exercise needs.;Develop an individualized exercise prescription for aerobic and resistive training based on initial evaluation findings, risk stratification, comorbidities and participant's personal goals.       Expected Outcomes  Short Term: Attend rehab on a regular basis to increase amount of physical activity.;Long Term: Add in home exercise to make exercise part of routine and to increase amount of physical activity.;Long Term: Exercising regularly at least 3-5 days a week.       Increase Strength and Stamina  Yes       Intervention  Provide advice, education, support and counseling about physical activity/exercise needs.;Develop an individualized exercise prescription for aerobic and resistive training based on initial evaluation findings, risk stratification, comorbidities and participant's personal goals.       Expected Outcomes  Short Term: Increase workloads from initial exercise prescription for resistance, speed, and METs.;Short Term: Perform resistance training exercises routinely during rehab and add in resistance training at home;Long  Term: Improve cardiorespiratory fitness, muscular endurance and strength as measured by increased METs and functional capacity (6MWT)       Able to understand and use rate of perceived exertion (RPE) scale  Yes       Intervention  Provide education and explanation on how to use RPE scale       Expected Outcomes  Short Term: Able to use RPE daily in rehab to express subjective intensity level;Long Term:  Able to use RPE to guide intensity level when exercising independently       Knowledge and understanding of Target Heart Rate Range (THRR)  Yes       Intervention  Provide education and explanation of THRR including how the numbers were predicted and where they are located for reference       Expected Outcomes  Short Term: Able to state/look up THRR;Long Term: Able to use THRR to govern intensity when exercising independently;Short Term: Able to use daily as guideline for intensity in rehab       Able to check pulse independently  Yes       Intervention  Provide education and demonstration on how to check pulse in carotid and radial arteries.;Review the importance of being able to check your own pulse for safety during independent exercise       Expected Outcomes  Short Term: Able to explain why pulse checking is important during independent exercise;Long Term: Able to check pulse independently and accurately       Understanding of Exercise Prescription  Yes       Intervention  Provide education, explanation, and written materials on patient's individual exercise prescription       Expected Outcomes  Short Term: Able to explain program exercise prescription;Long Term: Able to explain home exercise prescription to exercise independently          Exercise Goals Re-Evaluation : Exercise Goals Re-Evaluation    [place] Name 05/08/19 1120  05/23/19 1207 05/24/19 1036 06/21/19 0915       Exercise Goal Re-Evaluation   Exercise Goals Review  Increase Physical Activity;Increase Strength and Stamina;Able to  understand and use rate of perceived exertion (RPE) scale;Knowledge and understanding of Target Heart Rate Range (THRR);Understanding of Exercise Prescription  Increase Physical Activity;Increase Strength and Stamina;Able to understand and use rate of perceived exertion (RPE) scale;Knowledge and understanding of Target Heart Rate Range (THRR);Able to check pulse independently;Understanding of Exercise Prescription  Increase Physical Activity;Increase Strength and Stamina;Able to understand and use rate of perceived exertion (RPE) scale;Knowledge and understanding of Target Heart Rate Range (THRR);Able to check pulse independently;Understanding of Exercise Prescription  Increase Physical Activity;Increase Strength and Stamina;Able to understand and use rate of perceived exertion (RPE) scale;Knowledge and understanding of Target Heart Rate Range (THRR);Able to check pulse independently;Understanding of Exercise Prescription    Comments  Pt first day of CR program. Pt tolerated exercise Rx well and understands THRR, RPE scale, and exercise Rx.  Reviewed METs and goals with Pt. Pt is progressing well and has a MET level of 5.1. Pt continues to increase workloads. Pt understands home exercise goals and is currently walking 2-4 days per week for 30-62mnutes in addition to CR program.  Reviewed HEP, METs, and goals wiht Pt. Pt is progressing well in the program and has a MET level of 5.4. Pt understands home exercise goals, exericse Rx, THRR, RPE scale, end points of exercise, warm up and cool down stretches, weather precautions, and NTG use. Pt is not curretnly exercising at home. Encouraged Pt to start walking 2-4 days per week for 30-45 minutes in addition to CR program.  Reviewed METs and goals with Pt. Pt is progressing well and has a MET level of 6.3. Pt continues to increase workloads and METs with every session. Pt continues to exercise at home by wlaking 30-45 minutes 2-4 days per week in addition to CR program.     Expected Outcomes  Will continue to monitor and progress Pt as tolerated.  Will continue to monitor and progress Pt as tolerated.  Will continue to monitor and progress Pt as tolerated.  Will continue to monitor and progress Pt as tolerated.        Discharge Exercise Prescription (Final Exercise Prescription Changes): Exercise Prescription Changes - 06/12/19 0915      Response to Exercise   Blood Pressure (Admit)  98/58    Blood Pressure (Exercise)  144/82    Blood Pressure (Exit)  100/60    Heart Rate (Admit)  67 bpm    Heart Rate (Exercise)  129 bpm    Heart Rate (Exit)  77 bpm    Rating of Perceived Exertion (Exercise)  13    Symptoms  None    Duration  Continue with 30 min of aerobic exercise without signs/symptoms of physical distress.    Intensity  THRR unchanged      Progression   Progression  Continue to progress workloads to maintain intensity without signs/symptoms of physical distress.    Average METs  6.3      Resistance Training   Training Prescription  Yes    Weight  5 lbs.     Reps  10-15    Time  10 Minutes      Interval Training   Interval Training  No      Treadmill   MPH  2.6    Grade  2.5    Minutes  15      NuStep  Level  4    SPM  85    Minutes  15    METs  8.8      Home Exercise Plan   Plans to continue exercise at  Home (comment)    Frequency  Add 3 additional days to program exercise sessions.    Initial Home Exercises Provided  05/24/19       Nutrition:  Target Goals: Understanding of nutrition guidelines, daily intake of sodium '1500mg'$ , cholesterol '200mg'$ , calories 30% from fat and 7% or less from saturated fats, daily to have 5 or more servings of fruits and vegetables.  Biometrics: Pre Biometrics - 05/04/19 1028      Pre Biometrics   Height  5' 11.25" (1.81 m)    Weight  75.2 kg    Waist Circumference  34 inches    Hip Circumference  38 inches    Waist to Hip Ratio  0.89 %    BMI (Calculated)  22.95    Triceps Skinfold   17 mm    % Body Fat  23.2 %    Grip Strength  43 kg    Flexibility  12.5 in    Single Leg Stand  10.56 seconds        Nutrition Therapy Plan and Nutrition Goals:   Nutrition Assessments:   Nutrition Goals Re-Evaluation:   Nutrition Goals Discharge (Final Nutrition Goals Re-Evaluation):   Psychosocial: Target Goals: Acknowledge presence or absence of significant depression and/or stress, maximize coping skills, provide positive support system. Participant is able to verbalize types and ability to use techniques and skills needed for reducing stress and depression.  Initial Review & Psychosocial Screening: Initial Psych Review & Screening - 05/04/19 1022      Initial Review   Current issues with  Current Depression   Reports some occasional depression.     Family Dynamics   Good Support System?  Yes   Pt reports support from his wife and family.     Barriers   Psychosocial barriers to participate in program  The patient should benefit from training in stress management and relaxation.      Screening Interventions   Interventions  Encouraged to exercise;Provide feedback about the scores to participant    Expected Outcomes  Short Term goal: Identification and review with participant of any Quality of Life or Depression concerns found by scoring the questionnaire.;Long Term goal: The participant improves quality of Life and PHQ9 Scores as seen by post scores and/or verbalization of changes       Quality of Life Scores: Quality of Life - 05/04/19 1011      Quality of Life   Select  Quality of Life      Quality of Life Scores   Health/Function Pre  20.43 %    Socioeconomic Pre  19.29 %    Psych/Spiritual Pre  19.79 %    Family Pre  19.2 %    GLOBAL Pre  19.88 %      Scores of 19 and below usually indicate a poorer quality of life in these areas.  A difference of  2-3 points is a clinically meaningful difference.  A difference of 2-3 points in the total score of the  Quality of Life Index has been associated with significant improvement in overall quality of life, self-image, physical symptoms, and general health in studies assessing change in quality of life.  PHQ-9: Recent Review Flowsheet Data    Depression screen [facility] 2/9 05/04/2019   Decreased Interest  0   Down, Depressed, Hopeless 1   PHQ - 2 Score 1     Interpretation of Total Score  Total Score Depression Severity:  1-4 = Minimal depression, 5-9 = Mild depression, 10-14 = Moderate depression, 15-19 = Moderately severe depression, 20-27 = Severe depression   Psychosocial Evaluation and Intervention: Psychosocial Evaluation - 05/08/19 1358      Psychosocial Evaluation & Interventions   Interventions  Encouraged to exercise with the program and follow exercise prescription;Relaxation education;Stress management education    Comments  Adam Farmer reports some occasional depression but that it is manageable.  Adam Farmer enjoys playing golf, fishing, and spending time with his grandchildren.    Expected Outcomes  Adam Farmer will continue to report ability to manage his depression.    Continue Psychosocial Services   Follow up required by staff       Psychosocial Re-Evaluation: Psychosocial Re-Evaluation    [place] Name 05/25/19 1338 06/20/19 1126           Psychosocial Re-Evaluation   Current issues with  Current Depression  Current Depression      Comments  Adam Farmer has not voiced any increased stressors or increased depression  Adam Farmer has not voiced any increased stressors or increased depression      Interventions  Encouraged to attend Cardiac Rehabilitation for the exercise;Stress management education  Encouraged to attend Cardiac Rehabilitation for the exercise;Stress management education      Continue Psychosocial Services   Follow up required by staff  Follow up required by staff         Psychosocial Discharge (Final Psychosocial Re-Evaluation): Psychosocial Re-Evaluation - 06/20/19 1126       Psychosocial Re-Evaluation   Current issues with  Current Depression    Comments  Adam Farmer has not voiced any increased stressors or increased depression    Interventions  Encouraged to attend Cardiac Rehabilitation for the exercise;Stress management education    Continue Psychosocial Services   Follow up required by staff       Vocational Rehabilitation: Provide vocational rehab assistance to qualifying candidates.   Vocational Rehab Evaluation & Intervention: Vocational Rehab - 05/04/19 1025      Initial Vocational Rehab Evaluation & Intervention   Assessment shows need for Vocational Rehabilitation  No   Pt is retired.      Education: Education Goals: Education classes will be provided on a weekly basis, covering required topics. Participant will state understanding/return demonstration of topics presented.  Learning Barriers/Preferences: Learning Barriers/Preferences - 05/04/19 1030      Learning Barriers/Preferences   Learning Barriers  Hearing;Sight    Learning Preferences  Individual Instruction;Skilled Demonstration       Education Topics: Hypertension, Hypertension Reduction -Define heart disease and high blood pressure. Discus how high blood pressure affects the body and ways to reduce high blood pressure.   Exercise and Your Heart -Discuss why it is important to exercise, the FITT principles of exercise, normal and abnormal responses to exercise, and how to exercise safely.   Angina -Discuss definition of angina, causes of angina, treatment of angina, and how to decrease risk of having angina.   Cardiac Medications -Review what the following cardiac medications are used for, how they affect the body, and side effects that may occur when taking the medications.  Medications include Aspirin, Beta blockers, calcium channel blockers, ACE Inhibitors, angiotensin receptor blockers, diuretics, digoxin, and antihyperlipidemics.   Congestive Heart Failure -Discuss the  definition of CHF, how to live with CHF, the signs and symptoms of CHF,  and how keep track of weight and sodium intake.   Heart Disease and Intimacy -Discus the effect sexual activity has on the heart, how changes occur during intimacy as we age, and safety during sexual activity.   Smoking Cessation / COPD -Discuss different methods to quit smoking, the health benefits of quitting smoking, and the definition of COPD.   Nutrition I: Fats -Discuss the types of cholesterol, what cholesterol does to the heart, and how cholesterol levels can be controlled.   Nutrition II: Labels -Discuss the different components of food labels and how to read food label   Heart Parts/Heart Disease and PAD -Discuss the anatomy of the heart, the pathway of blood circulation through the heart, and these are affected by heart disease.   Stress I: Signs and Symptoms -Discuss the causes of stress, how stress may lead to anxiety and depression, and ways to limit stress.   Stress II: Relaxation -Discuss different types of relaxation techniques to limit stress.   Warning Signs of Stroke / TIA -Discuss definition of a stroke, what the signs and symptoms are of a stroke, and how to identify when someone is having stroke.   Knowledge Questionnaire Score: Knowledge Questionnaire Score - 05/04/19 1011      Knowledge Questionnaire Score   Pre Score  25/28       Core Components/Risk Factors/Patient Goals at Admission: Personal Goals and Risk Factors at Admission - 05/04/19 1031      Core Components/Risk Factors/Patient Goals on Admission    Weight Management  Yes;Weight Maintenance    Intervention  Weight Management: Develop a combined nutrition and exercise program designed to reach desired caloric intake, while maintaining appropriate intake of nutrient and fiber, sodium and fats, and appropriate energy expenditure required for the weight goal.;Weight Management: Provide education and appropriate  resources to help participant work on and attain dietary goals.    Admit Weight  165 lb 12.6 oz (75.2 kg)    Expected Outcomes  Short Term: Continue to assess and modify interventions until short term weight is achieved;Long Term: Adherence to nutrition and physical activity/exercise program aimed toward attainment of established weight goal;Weight Maintenance: Understanding of the daily nutrition guidelines, which includes 25-35% calories from fat, 7% or less cal from saturated fats, less than '200mg'$  cholesterol, less than 1.5gm of sodium, & 5 or more servings of fruits and vegetables daily;Understanding recommendations for meals to include 15-35% energy as protein, 25-35% energy from fat, 35-60% energy from carbohydrates, less than '200mg'$  of dietary cholesterol, 20-35 gm of total fiber daily;Understanding of distribution of calorie intake throughout the day with the consumption of 4-5 meals/snacks    Tobacco Cessation  Yes    Number of packs per day  0.5    Intervention  Assist the participant in steps to quit. Provide individualized education and counseling about committing to Tobacco Cessation, relapse prevention, and pharmacological support that can be provided by physician.;Advice worker, assist with locating and accessing local/national Quit Smoking programs, and support quit date choice.    Expected Outcomes  Short Term: Will demonstrate readiness to quit, by selecting a quit date.;Long Term: Complete abstinence from all tobacco products for at least 12 months from quit date.;Short Term: Will quit all tobacco product use, adhering to prevention of relapse plan.    Diabetes  Yes    Intervention  Provide education about signs/symptoms and action to take for hypo/hyperglycemia.;Provide education about proper nutrition, including hydration, and aerobic/resistive exercise prescription along with prescribed medications to achieve blood  glucose in normal ranges: Fasting glucose 65-99 mg/dL     Expected Outcomes  Short Term: Participant verbalizes understanding of the signs/symptoms and immediate care of hyper/hypoglycemia, proper foot care and importance of medication, aerobic/resistive exercise and nutrition plan for blood glucose control.;Long Term: Attainment of HbA1C < 7%.    Lipids  Yes    Intervention  Provide education and support for participant on nutrition & aerobic/resistive exercise along with prescribed medications to achieve LDL '70mg'$ , HDL >'40mg'$ .    Expected Outcomes  Short Term: Participant states understanding of desired cholesterol values and is compliant with medications prescribed. Participant is following exercise prescription and nutrition guidelines.;Long Term: Cholesterol controlled with medications as prescribed, with individualized exercise RX and with personalized nutrition plan. Value goals: LDL < '70mg'$ , HDL > 40 mg.       Core Components/Risk Factors/Patient Goals Review:  Goals and Risk Factor Review    Row Name 05/08/19 1406 05/25/19 1341 06/20/19 1129         Core Components/Risk Factors/Patient Goals Review   Personal Goals Review  Weight Management/Obesity;Tobacco Cessation;Diabetes;Lipids  Weight Management/Obesity;Tobacco Cessation;Diabetes;Lipids  Weight Management/Obesity;Tobacco Cessation;Diabetes;Lipids     Review  Pt with multiple CAD RFs willing to participate in CR exercise.  Adam Farmer would like to walk 3 miles/day three times a week.  Pt with multiple CAD RFs willing to participate in CR exercise.  Adam Farmer would like to walk 3 miles/day three times a week. Adam Farmer has not reported any more dizziness since his lisinopril was decreased  Pt with multiple CAD RFs willing to participate in CR exercise.  Adam Farmer has been doing well with exercise and works hard when in attendance. Adam Farmer has quit smoking! Adam Farmer still sometimes has systolic resting BP's in the 90's remains asymptomatic     Expected Outcomes  Pt will continue to participate in CR exercise,  nutrition, and lifestyle modification opportunities.  Pt will continue to participate in CR exercise, nutrition, and lifestyle modification opportunities.  Pt will continue to participate in CR exercise, nutrition, and lifestyle modification opportunities.        Core Components/Risk Factors/Patient Goals at Discharge (Final Review):  Goals and Risk Factor Review - 06/20/19 1129      Core Components/Risk Factors/Patient Goals Review   Personal Goals Review  Weight Management/Obesity;Tobacco Cessation;Diabetes;Lipids    Review  Pt with multiple CAD RFs willing to participate in CR exercise.  Adam Farmer has been doing well with exercise and works hard when in attendance. Adam Farmer has quit smoking! Adam Farmer still sometimes has systolic resting BP's in the 90's remains asymptomatic    Expected Outcomes  Pt will continue to participate in CR exercise, nutrition, and lifestyle modification opportunities.       ITP Comments: ITP Comments    Row Name 05/04/19 1021 05/08/19 1358 05/25/19 1336 06/20/19 1125     ITP Comments  Dr. Fransico Him, Medical Director  Pt started exercise and tolerated it well.  30 day ITP comments. Patient has good attendance and is doing well with exercise at phase 2 cardiac rehab.  30 day ITP comments. Adam Farmer  has good attendance and is doing well with exercise at phase 2 cardiac rehab.Adam Farmer has stopped smoking.       Comments: See ITP comments.Barnet Pall, RN,BSN 06/22/2019 1:06 PM

## 2019-06-21 ENCOUNTER — Encounter (HOSPITAL_COMMUNITY)
Admission: RE | Admit: 2019-06-21 | Discharge: 2019-06-21 | Disposition: A | Discharge status: Home / Self Care | Payer: Medicare Other | Source: Ambulatory Visit | Attending: Interventional Cardiology | Admitting: Interventional Cardiology

## 2019-06-21 ENCOUNTER — Other Ambulatory Visit: Payer: Self-pay

## 2019-06-21 DIAGNOSIS — I2102 ST elevation (STEMI) myocardial infarction involving left anterior descending coronary artery: Secondary | ICD-10-CM | POA: Diagnosis not present

## 2019-06-21 DIAGNOSIS — Z955 Presence of coronary angioplasty implant and graft: Secondary | ICD-10-CM

## 2019-06-23 ENCOUNTER — Encounter (HOSPITAL_COMMUNITY)
Admission: RE | Admit: 2019-06-23 | Discharge: 2019-06-23 | Disposition: A | Discharge status: Home / Self Care | Payer: Medicare Other | Source: Ambulatory Visit | Attending: Interventional Cardiology | Admitting: Interventional Cardiology

## 2019-06-23 ENCOUNTER — Other Ambulatory Visit: Payer: Self-pay

## 2019-06-23 DIAGNOSIS — I2102 ST elevation (STEMI) myocardial infarction involving left anterior descending coronary artery: Secondary | ICD-10-CM | POA: Diagnosis not present

## 2019-06-23 DIAGNOSIS — Z955 Presence of coronary angioplasty implant and graft: Secondary | ICD-10-CM

## 2019-06-26 ENCOUNTER — Encounter (HOSPITAL_COMMUNITY)
Admission: RE | Admit: 2019-06-26 | Discharge: 2019-06-26 | Disposition: A | Discharge status: Home / Self Care | Payer: Medicare Other | Source: Ambulatory Visit | Attending: Interventional Cardiology | Admitting: Interventional Cardiology

## 2019-06-26 ENCOUNTER — Other Ambulatory Visit: Payer: Self-pay

## 2019-06-26 DIAGNOSIS — Z955 Presence of coronary angioplasty implant and graft: Secondary | ICD-10-CM

## 2019-06-26 DIAGNOSIS — I2102 ST elevation (STEMI) myocardial infarction involving left anterior descending coronary artery: Secondary | ICD-10-CM

## 2019-06-26 NOTE — Progress Notes (Signed)
Blood pressures from today are as follows. Resting heart rate 83. Blood pressure resting 92/54. Blood pressure 142/70 on the nustep. Exit blood pressure 96/65. Heart rate 93. Will fax exercise flow sheets to Dr. Hebert Soho office for review as Mr Adam Farmer continues to have resting systolic blood pressures in the 90's despite his lisinopril being decreased to 2.5 mg once a day. Adam Farmer denies feeling lightheaded.Will continue to monitor the patient throughout  the program.Adam Venetia Maxon, RN,BSN 06/26/2019 10:18 AM

## 2019-06-28 ENCOUNTER — Encounter (HOSPITAL_COMMUNITY)
Admission: RE | Admit: 2019-06-28 | Discharge: 2019-06-28 | Disposition: A | Discharge status: Home / Self Care | Payer: Medicare Other | Source: Ambulatory Visit | Attending: Interventional Cardiology | Admitting: Interventional Cardiology

## 2019-06-28 ENCOUNTER — Other Ambulatory Visit: Payer: Self-pay

## 2019-06-28 VITALS — Ht 71.25 in | Wt 164.5 lb

## 2019-06-28 DIAGNOSIS — Z955 Presence of coronary angioplasty implant and graft: Secondary | ICD-10-CM

## 2019-06-28 DIAGNOSIS — I2102 ST elevation (STEMI) myocardial infarction involving left anterior descending coronary artery: Secondary | ICD-10-CM | POA: Diagnosis not present

## 2019-06-30 ENCOUNTER — Other Ambulatory Visit: Payer: Self-pay

## 2019-06-30 ENCOUNTER — Encounter (HOSPITAL_COMMUNITY)
Admission: RE | Admit: 2019-06-30 | Discharge: 2019-06-30 | Disposition: A | Discharge status: Home / Self Care | Payer: Medicare Other | Source: Ambulatory Visit | Attending: Interventional Cardiology | Admitting: Interventional Cardiology

## 2019-06-30 DIAGNOSIS — I2102 ST elevation (STEMI) myocardial infarction involving left anterior descending coronary artery: Secondary | ICD-10-CM

## 2019-06-30 DIAGNOSIS — Z955 Presence of coronary angioplasty implant and graft: Secondary | ICD-10-CM

## 2019-06-30 NOTE — Progress Notes (Signed)
Discharge Progress Report  Patient Details  Name: Adam Farmer MRN: [id number] Date of Birth: 1951-07-13 Referring Provider:     [place] from 05/04/2019 in [place]  Referring Provider  Dr. Irish Lack        Number of Visits: 23  Reason for Discharge:  Patient reached a stable level of exercise. Patient independent in their exercise. Patient has met program and personal goals.  Smoking History:  Social History   Tobacco Use  Smoking Status Current Every Day Smoker  . Packs/day: 1.00  . Years: 26.00  . Pack years: 26.00  . Types: Cigarettes  Smokeless Tobacco Never Used  Tobacco Comment   discussed strategy    Diagnosis:  Status post coronary artery stent placement  ST elevation myocardial infarction involving left anterior descending (LAD) coronary artery (HCC)  ADL UCSD:   Initial Exercise Prescription: Initial Exercise Prescription - 05/04/19 1000      Date of Initial Exercise RX and Referring Provider   Date  05/04/19    Referring Provider  Dr. Irish Lack     Expected Discharge Date  06/30/19      Treadmill   MPH  2.4    Grade  1    Minutes  15      NuStep   Level  3    SPM  85    Minutes  15    METs  2.5      Prescription Details   Frequency (times per week)  3    Duration  Progress to 30 minutes of continuous aerobic without signs/symptoms of physical distress      Intensity   THRR 40-80% of Max Heartrate  66-123    Ratings of Perceived Exertion  11-13      Progression   Progression  Continue to progress workloads to maintain intensity without signs/symptoms of physical distress.      Resistance Training   Training Prescription  Yes    Weight  4 lbs.     Reps  10-15       Discharge Exercise Prescription (Final Exercise Prescription Changes): Exercise Prescription Changes - 06/30/19 1100      Response to Exercise   Blood Pressure (Admit)  92/60    Blood Pressure (Exercise)   138/72    Blood Pressure (Exit)  100/60    Heart Rate (Admit)  77 bpm    Heart Rate (Exercise)  100 bpm    Heart Rate (Exit)  74 bpm    Rating of Perceived Exertion (Exercise)  14    Symptoms  None    Comments  Pt last day of exercise.     Duration  Continue with 30 min of aerobic exercise without signs/symptoms of physical distress.    Intensity  THRR unchanged      Progression   Progression  Continue to progress workloads to maintain intensity without signs/symptoms of physical distress.    Average METs  5.5      Resistance Training   Training Prescription  Yes    Weight  5 lbs.     Reps  10-15    Time  10 Minutes      Interval Training   Interval Training  No      Treadmill   MPH  2.6    Grade  2.5    Minutes  15      NuStep   Level  6    SPM  85  Minutes  15    METs  7.3      Home Exercise Plan   Plans to continue exercise at  Home (comment)    Frequency  Add 3 additional days to program exercise sessions.    Initial Home Exercises Provided  05/24/19       Functional Capacity: 6 Minute Walk    Row Name 05/04/19 1021 06/28/19 1103       6 Minute Walk   Phase  Initial  Discharge    Distance  1335 feet  1576 feet    Distance % Change  -  18.05 %    Distance Feet Change  -  241 ft    Walk Time  6 minutes  6 minutes    # of Rest Breaks  0  0    MPH  2.5  2.9    METS  3.3  3.6    RPE  11  9    Perceived Dyspnea   0  0    VO2 Peak  11.56  12.91    Symptoms  No  No    Resting HR  74 bpm  75 bpm    Resting BP  102/60  98/58    Resting Oxygen Saturation   96 %  -    Exercise Oxygen Saturation  during 6 min walk  97 %  -    Max Ex. HR  82 bpm  84 bpm    Max Ex. BP  120/64  110/60    2 Minute Post BP  104/62  98/60       Psychological, QOL, Others - Outcomes: PHQ 2/9: Depression screen [facility] 2/9 06/30/2019 05/04/2019  Decreased Interest 0 0  Down, Depressed, Hopeless 0 1  PHQ - 2 Score 0 1    Quality of Life: Quality of Life - 05/04/19 1011       Quality of Life   Select  Quality of Life      Quality of Life Scores   Health/Function Pre  20.43 %    Socioeconomic Pre  19.29 %    Psych/Spiritual Pre  19.79 %    Family Pre  19.2 %    GLOBAL Pre  19.88 %       Personal Goals: Goals established at orientation with interventions provided to work toward goal. Personal Goals and Risk Factors at Admission - 05/04/19 1031      Core Components/Risk Factors/Patient Goals on Admission    Weight Management  Yes;Weight Maintenance    Intervention  Weight Management: Develop a combined nutrition and exercise program designed to reach desired caloric intake, while maintaining appropriate intake of nutrient and fiber, sodium and fats, and appropriate energy expenditure required for the weight goal.;Weight Management: Provide education and appropriate resources to help participant work on and attain dietary goals.    Admit Weight  165 lb 12.6 oz (75.2 kg)    Expected Outcomes  Short Term: Continue to assess and modify interventions until short term weight is achieved;Long Term: Adherence to nutrition and physical activity/exercise program aimed toward attainment of established weight goal;Weight Maintenance: Understanding of the daily nutrition guidelines, which includes 25-35% calories from fat, 7% or less cal from saturated fats, less than '200mg'$  cholesterol, less than 1.5gm of sodium, & 5 or more servings of fruits and vegetables daily;Understanding recommendations for meals to include 15-35% energy as protein, 25-35% energy from fat, 35-60% energy from carbohydrates, less than '200mg'$  of dietary cholesterol, 20-35 gm of total  fiber daily;Understanding of distribution of calorie intake throughout the day with the consumption of 4-5 meals/snacks    Tobacco Cessation  Yes    Number of packs per day  0.5    Intervention  Assist the participant in steps to quit. Provide individualized education and counseling about committing to Tobacco Cessation, relapse  prevention, and pharmacological support that can be provided by physician.;Advice worker, assist with locating and accessing local/national Quit Smoking programs, and support quit date choice.    Expected Outcomes  Short Term: Will demonstrate readiness to quit, by selecting a quit date.;Long Term: Complete abstinence from all tobacco products for at least 12 months from quit date.;Short Term: Will quit all tobacco product use, adhering to prevention of relapse plan.    Diabetes  Yes    Intervention  Provide education about signs/symptoms and action to take for hypo/hyperglycemia.;Provide education about proper nutrition, including hydration, and aerobic/resistive exercise prescription along with prescribed medications to achieve blood glucose in normal ranges: Fasting glucose 65-99 mg/dL    Expected Outcomes  Short Term: Participant verbalizes understanding of the signs/symptoms and immediate care of hyper/hypoglycemia, proper foot care and importance of medication, aerobic/resistive exercise and nutrition plan for blood glucose control.;Long Term: Attainment of HbA1C < 7%.    Lipids  Yes    Intervention  Provide education and support for participant on nutrition & aerobic/resistive exercise along with prescribed medications to achieve LDL '70mg'$ , HDL >'40mg'$ .    Expected Outcomes  Short Term: Participant states understanding of desired cholesterol values and is compliant with medications prescribed. Participant is following exercise prescription and nutrition guidelines.;Long Term: Cholesterol controlled with medications as prescribed, with individualized exercise RX and with personalized nutrition plan. Value goals: LDL < '70mg'$ , HDL > 40 mg.        Personal Goals Discharge: Goals and Risk Factor Review    Row Name 05/08/19 1406 05/25/19 1341 06/20/19 1129 06/30/19 1017       Core Components/Risk Factors/Patient Goals Review   Personal Goals Review  Weight Management/Obesity;Tobacco  Cessation;Diabetes;Lipids  Weight Management/Obesity;Tobacco Cessation;Diabetes;Lipids  Weight Management/Obesity;Tobacco Cessation;Diabetes;Lipids  Weight Management/Obesity;Tobacco Cessation;Diabetes;Lipids    Review  Pt with multiple CAD RFs willing to participate in CR exercise.  Jemmie would like to walk 3 miles/day three times a week.  Pt with multiple CAD RFs willing to participate in CR exercise.  Taquan would like to walk 3 miles/day three times a week. Ozzie Hoyle has not reported any more dizziness since his lisinopril was decreased  Pt with multiple CAD RFs willing to participate in CR exercise.  Ozzie Hoyle has been doing well with exercise and works hard when in attendance. Ozzie Hoyle has quit smoking! Ozzie Hoyle still sometimes has systolic resting BP's in the 90's remains asymptomatic  Pt with multiple CAD RFs willing to participate in CR exercise.  Cliff graduates from phase 2 cardiac rehab today. Ozzie Hoyle has done well and has abatained form smoking. Cliff plans to continue exercise by walking at home.    Expected Outcomes  Pt will continue to participate in CR exercise, nutrition, and lifestyle modification opportunities.  Pt will continue to participate in CR exercise, nutrition, and lifestyle modification opportunities.  Pt will continue to participate in CR exercise, nutrition, and lifestyle modification opportunities.  Ozzie Hoyle will continue to exercise at home follow nutrtion and lifestyle modificatons upon discharge from phase 2 cardiac rehab       Exercise Goals and Review: Exercise Goals    Row Name 05/04/19 1027  Exercise Goals   Increase Physical Activity  Yes       Intervention  Provide advice, education, support and counseling about physical activity/exercise needs.;Develop an individualized exercise prescription for aerobic and resistive training based on initial evaluation findings, risk stratification, comorbidities and participant's personal goals.       Expected Outcomes  Short  Term: Attend rehab on a regular basis to increase amount of physical activity.;Long Term: Add in home exercise to make exercise part of routine and to increase amount of physical activity.;Long Term: Exercising regularly at least 3-5 days a week.       Increase Strength and Stamina  Yes       Intervention  Provide advice, education, support and counseling about physical activity/exercise needs.;Develop an individualized exercise prescription for aerobic and resistive training based on initial evaluation findings, risk stratification, comorbidities and participant's personal goals.       Expected Outcomes  Short Term: Increase workloads from initial exercise prescription for resistance, speed, and METs.;Short Term: Perform resistance training exercises routinely during rehab and add in resistance training at home;Long Term: Improve cardiorespiratory fitness, muscular endurance and strength as measured by increased METs and functional capacity (6MWT)       Able to understand and use rate of perceived exertion (RPE) scale  Yes       Intervention  Provide education and explanation on how to use RPE scale       Expected Outcomes  Short Term: Able to use RPE daily in rehab to express subjective intensity level;Long Term:  Able to use RPE to guide intensity level when exercising independently       Knowledge and understanding of Target Heart Rate Range (THRR)  Yes       Intervention  Provide education and explanation of THRR including how the numbers were predicted and where they are located for reference       Expected Outcomes  Short Term: Able to state/look up THRR;Long Term: Able to use THRR to govern intensity when exercising independently;Short Term: Able to use daily as guideline for intensity in rehab       Able to check pulse independently  Yes       Intervention  Provide education and demonstration on how to check pulse in carotid and radial arteries.;Review the importance of being able to check your  own pulse for safety during independent exercise       Expected Outcomes  Short Term: Able to explain why pulse checking is important during independent exercise;Long Term: Able to check pulse independently and accurately       Understanding of Exercise Prescription  Yes       Intervention  Provide education, explanation, and written materials on patient's individual exercise prescription       Expected Outcomes  Short Term: Able to explain program exercise prescription;Long Term: Able to explain home exercise prescription to exercise independently          Exercise Goals Re-Evaluation: Exercise Goals Re-Evaluation    Row Name 05/08/19 1120 05/23/19 1207 05/24/19 1036 06/21/19 0915 06/30/19 1117     Exercise Goal Re-Evaluation   Exercise Goals Review  Increase Physical Activity;Increase Strength and Stamina;Able to understand and use rate of perceived exertion (RPE) scale;Knowledge and understanding of Target Heart Rate Range (THRR);Understanding of Exercise Prescription  Increase Physical Activity;Increase Strength and Stamina;Able to understand and use rate of perceived exertion (RPE) scale;Knowledge and understanding of Target Heart Rate Range (THRR);Able to check pulse independently;Understanding of Exercise Prescription  Increase Physical Activity;Increase Strength and Stamina;Able to understand and use rate of perceived exertion (RPE) scale;Knowledge and understanding of Target Heart Rate Range (THRR);Able to check pulse independently;Understanding of Exercise Prescription  Increase Physical Activity;Increase Strength and Stamina;Able to understand and use rate of perceived exertion (RPE) scale;Knowledge and understanding of Target Heart Rate Range (THRR);Able to check pulse independently;Understanding of Exercise Prescription  Increase Physical Activity;Understanding of Exercise Prescription;Increase Strength and Stamina;Knowledge and understanding of Target Heart Rate Range (THRR);Able to  understand and use rate of perceived exertion (RPE) scale;Able to check pulse independently   Comments  Pt first day of CR program. Pt tolerated exercise Rx well and understands THRR, RPE scale, and exercise Rx.  Reviewed METs and goals with Pt. Pt is progressing well and has a MET level of 5.1. Pt continues to increase workloads. Pt understands home exercise goals and is currently walking 2-4 days per week for 30-34mnutes in addition to CR program.  Reviewed HEP, METs, and goals wiht Pt. Pt is progressing well in the program and has a MET level of 5.4. Pt understands home exercise goals, exericse Rx, THRR, RPE scale, end points of exercise, warm up and cool down stretches, weather precautions, and NTG use. Pt is not curretnly exercising at home. Encouraged Pt to start walking 2-4 days per week for 30-45 minutes in addition to CR program.  Reviewed METs and goals with Pt. Pt is progressing well and has a MET level of 6.3. Pt continues to increase workloads and METs with every session. Pt continues to exercise at home by wlaking 30-45 minutes 2-4 days per week in addition to CR program.  Pt graduated CR program today. Pt progressed well and ended with a MET level of 6.0. Pt increased his METs and workloads gradaully. Pt states he will continue walking at home 5-7 days per week for 30-45 minutes.   Expected Outcomes  Will continue to monitor and progress Pt as tolerated.  Will continue to monitor and progress Pt as tolerated.  Will continue to monitor and progress Pt as tolerated.  Will continue to monitor and progress Pt as tolerated.  Will continue to continue exercising at home.      Nutrition & Weight - Outcomes: Pre Biometrics - 05/04/19 1028      Pre Biometrics   Height  5' 11.25" (1.81 m)    Weight  165 lb 12.6 oz (75.2 kg)    Waist Circumference  34 inches    Hip Circumference  38 inches    Waist to Hip Ratio  0.89 %    BMI (Calculated)  22.95    Triceps Skinfold  17 mm    % Body Fat  23.2 %     Grip Strength  43 kg    Flexibility  12.5 in    Single Leg Stand  10.56 seconds      Post Biometrics - 06/28/19 1104       Post  Biometrics   Height  5' 11.25" (1.81 m)    Weight  164 lb 7.4 oz (74.6 kg)    Waist Circumference  35 inches    Hip Circumference  36.5 inches    Waist to Hip Ratio  0.96 %    BMI (Calculated)  22.77    Triceps Skinfold  17 mm    % Body Fat  23.6 %    Grip Strength  42 kg    Flexibility  11 in    Single Leg Stand  11.25 seconds  Nutrition:   Nutrition Discharge:   Education Questionnaire Score: Knowledge Questionnaire Score - 06/28/19 1106      Knowledge Questionnaire Score   Post Score  26/28       Goals reviewed with patient; copy given to patient.Pt graduated from cardiac rehab program today with completion of 36 exercise sessions in Phase II. Pt maintained good attendance and progressed nicely during his participation in rehab as evidenced by increased MET level.   Medication list reconciled. Repeat  PHQ score-0.  Pt has made significant lifestyle changes and should be commended for his success. Pt feels he has achieved his goals during cardiac rehab.   Pt plans to continue exercise by walking at home 5-7 days a week. We are proud of [place] progress. Cliff increased his distance on his post exercise walk test and stopped smoking!

## 2019-07-18 ENCOUNTER — Other Ambulatory Visit: Payer: Self-pay

## 2019-07-18 ENCOUNTER — Ambulatory Visit
Admission: RE | Admit: 2019-07-18 | Discharge: 2019-07-18 | Disposition: A | Discharge status: Home / Self Care | Payer: Medicare Other | Source: Ambulatory Visit | Attending: Internal Medicine | Admitting: Internal Medicine

## 2019-07-18 DIAGNOSIS — R911 Solitary pulmonary nodule: Secondary | ICD-10-CM

## 2019-07-19 ENCOUNTER — Other Ambulatory Visit: Payer: Self-pay | Admitting: Cardiology

## 2019-07-19 MED ORDER — TICAGRELOR 90 MG PO TABS: 90.0000 mg | ORAL_TABLET | Freq: Two times a day (BID) | ORAL | 2 refills | Status: DC

## 2019-07-19 MED ORDER — LISINOPRIL 2.5 MG PO TABS: 2.5000 mg | ORAL_TABLET | Freq: Every day | ORAL | 2 refills | Status: DC

## 2019-07-19 NOTE — Telephone Encounter (Signed)
Pt's medications were sent to pt's pharmacy as requested. Confirmation received.  

## 2019-08-01 ENCOUNTER — Other Ambulatory Visit: Payer: Self-pay | Admitting: Cardiology

## 2019-08-20 ENCOUNTER — Other Ambulatory Visit: Payer: Self-pay | Admitting: Cardiology

## 2019-08-22 MED ORDER — ASPIRIN EC 81 MG PO TBEC: 81.0000 mg | DELAYED_RELEASE_TABLET | Freq: Every day | ORAL | 3 refills | Status: DC

## 2019-08-22 NOTE — Telephone Encounter (Signed)
Aspirin EC 81 mg QD sent in for refill

## 2019-09-13 ENCOUNTER — Encounter: Payer: Self-pay | Admitting: Internal Medicine

## 2019-10-03 NOTE — Progress Notes (Signed)
Cardiology Office Note   Date:  10/04/2019   ID:  Efe, Bourland 1950/11/21, MRN [id number]  PCP:  Jani Gravel, MD    No chief complaint on file.  CAD  Wt Readings from Last 3 Encounters:  10/04/19 173 lb (78.5 kg)  06/28/19 164 lb 7.4 oz (74.6 kg)  05/04/19 165 lb 12.6 oz (75.2 kg)       History of Present Illness: Adam Farmer is a 69 y.o. male  with CAD who had a cath in 01/2019 when he presented with inferior STEMI.  He had PCI of RCA and LAD at that time.   Cath showed:  "Prox Cx to Mid Cx lesion is 25% stenosed.  Dist LM lesion is 20% stenosed.  Ost LAD lesion is 25% stenosed.  Mid LAD-1 lesion is 99% stenosed.  A drug-eluting stent was successfully placed using a STENT SYNERGY DES 3X16.  Post intervention, there is a 0% residual stenosis.  Mid LAD-2 lesion is 75% stenosed, edge dissection.  A drug-eluting stent was successfully placed using a STENT SYNERGY DES 3X8 for the distal edge dissection, overlapping the 16 mm stent.  Post intervention, there is a 0% residual stenosis.  Mid RCA-1 lesion is 90% stenosed.  A drug-eluting stent was successfully placed using a STENT SYNERGY DES 4X28.  Post intervention, there is a 0% residual stenosis.  The left ventricular systolic function is normal.  LV end diastolic pressure is normal.  The left ventricular ejection fraction is 55-65% by visual estimate.  There is no aortic valve stenosis.  Continue aggressive secondary prevention.   Start Metoprolol and high dose atorvastatin."  He had a rash after leaving the hospital and meds were changed.   Rash resolved when hydralazine was stopped.   Since the last visit, he has had some low BP readings.  Feels tired when the BP is low.   Increased A1C (7.1) in 2/21.  Denies : Chest pain. Dizziness. Leg edema. Nitroglycerin use. Orthopnea. Palpitations. Paroxysmal nocturnal dyspnea. Shortness of breath. Syncope.     Past Medical History:    Diagnosis Date  . ABNORMAL GLUCOSE NEC 05/03/2007  . ANXIETY 05/03/2007  . COLONIC POLYPS, HX OF 07/09/2009  . HYPERLIPIDEMIA, MIXED 05/03/2007  . STEMI (ST elevation myocardial infarction) ([place])    01/16/19 PCI to LAD, RCA  . TOBACCO USE 05/03/2007    Past Surgical History:  Procedure Laterality Date  . CORONARY STENT INTERVENTION N/A 01/16/2019   Procedure: CORONARY STENT INTERVENTION;  Surgeon: Jettie Booze, MD;  Location: [place] [facility];  Service: Cardiovascular;  Laterality: N/A;  . CORONARY/GRAFT ACUTE MI REVASCULARIZATION N/A 01/16/2019   Procedure: Coronary/Graft Acute MI Revascularization;  Surgeon: Jettie Booze, MD;  Location: [place] [facility];  Service: Cardiovascular;  Laterality: N/A;  . Fractured elbow, nose and toe    . LEFT HEART CATH AND CORONARY ANGIOGRAPHY N/A 01/16/2019   Procedure: LEFT HEART CATH AND CORONARY ANGIOGRAPHY;  Surgeon: Jettie Booze, MD;  Location: [place] [facility];  Service: Cardiovascular;  Laterality: N/A;  . WISDOM TOOTH EXTRACTION       Current Outpatient Medications  Medication Sig Dispense Refill  . aspirin EC 81 MG tablet Take 1 tablet (81 mg total) by mouth daily. 90 tablet 3  . atorvastatin (LIPITOR) 80 MG tablet TAKE 1 TABLET BY MOUTH EVERY DAY 90 tablet 2  . diphenhydrAMINE (BENADRYL ALLERGY) 25 MG tablet Take 25 mg ( 1 tablet) or 12.5 mg ( 1/2  tablet) every 4 to 6 hours as needed for rash 30 tablet 0  . escitalopram (LEXAPRO) 10 MG tablet Take 10 mg by mouth daily.    Marland Kitchen lisinopril (ZESTRIL) 2.5 MG tablet Take 1 tablet (2.5 mg total) by mouth daily. (Patient taking differently: Take 2.5 mg by mouth daily. Pt taking half tablet) 90 tablet 2  . metFORMIN (GLUCOPHAGE) 500 MG tablet Take 500 mg by mouth 2 (two) times daily with a meal.      . metoprolol tartrate (LOPRESSOR) 25 MG tablet Take 1 tablet by mouth twice daily 180 tablet 2  . nitroGLYCERIN (NITROSTAT) 0.4 MG SL tablet Place 1 tablet (0.4 mg total) under the  tongue every 5 (five) minutes x 3 doses as needed for chest pain. 25 tablet 2  . ticagrelor (BRILINTA) 90 MG TABS tablet Take 1 tablet (90 mg total) by mouth 2 (two) times daily. 180 tablet 2   No current facility-administered medications for this visit.    Allergies:   Latex and Hydralazine    Social History:  The patient  reports that he has been smoking cigarettes. He has a 26.00 pack-year smoking history. He has never used smokeless tobacco. He reports current alcohol use of about 1.0 - 2.0 standard drinks of alcohol per week. He reports that he does not use drugs.   Family History:  The patient's family history includes Colon polyps in his father; Diabetes in his father and paternal grandmother; Hyperlipidemia in his mother; Lung cancer in his father.    ROS:  Please see the history of present illness.   Otherwise, review of systems are positive for resumed smoking.   All other systems are reviewed and negative.    PHYSICAL EXAM: VS:  BP 112/74   Pulse 81   Ht 6' (1.829 m)   Wt 173 lb (78.5 kg)   SpO2 96%   BMI 23.46 kg/m  , BMI Body mass index is 23.46 kg/m. GEN: Well nourished, well developed, in no acute distress  HEENT: normal  Neck: no JVD, carotid bruits, or masses Cardiac: RRR; no murmurs, rubs, or gallops,no edema  Respiratory:  clear to auscultation bilaterally, normal work of breathing GI: soft, nontender, nondistended, + BS MS: no deformity or atrophy  Skin: warm and dry, no rash Neuro:  Strength and sensation are intact Psych: euthymic mood, full affect   EKG:   The ekg ordered today demonstrates NSR, inferior Q waves   Recent Labs: 01/17/2019: Hemoglobin 15.0; Platelets 184 01/19/2019: BUN 18; Creatinine, Ser 1.12; Potassium 3.9; Sodium 138 04/07/2019: ALT 25   Lipid Panel    Component Value Date/Time   CHOL 100 04/07/2019 1057   TRIG 208 (H) 04/07/2019 1057   HDL 35 (L) 04/07/2019 1057   CHOLHDL 2.9 04/07/2019 1057   CHOLHDL 4.5 01/17/2019 0635    VLDL 36 01/17/2019 0635   LDLCALC 23 04/07/2019 1057   LDLDIRECT 150.5 07/03/2009 1127     Other studies Reviewed: Additional studies/ records that were reviewed today with results demonstrating: labs reviewed.   ASSESSMENT AND PLAN:  1. CAD/Old MI: No angina.  Continue aggressive secondary prevention.  No bleeding issues.  Bruises easily.  Switch Brilinta to Plavix given excessive cost of Plavix. 300 mg on day 1 and 75 mg daily after that.  2. Hyperlipidemia: Controlled in 03/2019. Continue statin.   3. HTN: Occasional low BP, about 1x/week- in the 90s/50 range.  Would like to keep low dose ACE-I given renal protective effects, but he has  sx with the low BP.  Stop lisinopril.  4. Tobacco abuse: Started back smoking about a month ago.  Trying to quit cold [place] again. He smokes 3/4 pack daily.    Current medicines are reviewed at length with the patient today.  The patient concerns regarding his medicines were addressed.  The following changes have been made:  As above  Labs/ tests ordered today include:  No orders of the defined types were placed in this encounter.   Recommend 150 minutes/week of aerobic exercise Low fat, low carb, high fiber diet recommended  Disposition:   FU in 1 year   Signed, Larae Grooms, MD  10/04/2019 3:51 PM    [facility] [address], [place], [place]  [address] Phone: [phone]; Fax: [phone]

## 2019-10-04 ENCOUNTER — Ambulatory Visit (INDEPENDENT_AMBULATORY_CARE_PROVIDER_SITE_OTHER): Payer: Medicare Other | Admitting: Interventional Cardiology

## 2019-10-04 ENCOUNTER — Encounter: Payer: Self-pay | Admitting: Interventional Cardiology

## 2019-10-04 ENCOUNTER — Other Ambulatory Visit: Payer: Self-pay

## 2019-10-04 VITALS — BP 112/74 | HR 81 | Ht 72.0 in | Wt 173.0 lb

## 2019-10-04 DIAGNOSIS — E785 Hyperlipidemia, unspecified: Secondary | ICD-10-CM | POA: Diagnosis not present

## 2019-10-04 DIAGNOSIS — Z9861 Coronary angioplasty status: Secondary | ICD-10-CM

## 2019-10-04 DIAGNOSIS — Z720 Tobacco use: Secondary | ICD-10-CM

## 2019-10-04 DIAGNOSIS — I252 Old myocardial infarction: Secondary | ICD-10-CM | POA: Diagnosis not present

## 2019-10-04 DIAGNOSIS — I2510 Atherosclerotic heart disease of native coronary artery without angina pectoris: Secondary | ICD-10-CM | POA: Diagnosis not present

## 2019-10-04 MED ORDER — CLOPIDOGREL BISULFATE 75 MG PO TABS: ORAL_TABLET | ORAL | 3 refills | Status: DC

## 2019-10-04 NOTE — Patient Instructions (Signed)
Medication Instructions:  Your physician has recommended you make the following change in your medication:   1. STOP: lisinopril  2. STOP: Brilinta  3. START: clopidogrel 75 mg tablet: On Day 1, take 4 tablets (300 mg total) by mouth once, then starting on Day 2 and everyday thereafter take 1 tablet (75 mg total) once a day  *If you need a refill on your cardiac medications before your next appointment, please call your pharmacy*  Lab Work: None ordered  If you have labs (blood work) drawn today and your tests are completely normal, you will receive your results only by: Marland Kitchen MyChart Message (if you have MyChart) OR . A paper copy in the mail If you have any lab test that is abnormal or we need to change your treatment, we will call you to review the results.  Testing/Procedures: None ordered  Follow-Up: At [facility], you and your health needs are our priority.  As part of our continuing mission to provide you with exceptional heart care, we have created designated Provider Care Teams.  These Care Teams include your primary Cardiologist (physician) and Advanced Practice Providers (APPs -  Physician Assistants and Nurse Practitioners) who all work together to provide you with the care you need, when you need it.  Your next appointment:   12 month(s)  The format for your next appointment:   In Person  Provider:   You may see Larae Grooms, MD or one of the following Advanced Practice Providers on your designated Care Team:    Melina Copa, PA-C  Ermalinda Barrios, PA-C   Other Instructions

## 2020-03-28 ENCOUNTER — Other Ambulatory Visit: Payer: Self-pay

## 2020-03-28 ENCOUNTER — Ambulatory Visit (INDEPENDENT_AMBULATORY_CARE_PROVIDER_SITE_OTHER): Payer: Medicare Other | Admitting: Interventional Cardiology

## 2020-03-28 ENCOUNTER — Encounter: Payer: Self-pay | Admitting: Interventional Cardiology

## 2020-03-28 VITALS — BP 112/78 | HR 87 | Ht 72.0 in | Wt 167.2 lb

## 2020-03-28 DIAGNOSIS — E785 Hyperlipidemia, unspecified: Secondary | ICD-10-CM

## 2020-03-28 DIAGNOSIS — I252 Old myocardial infarction: Secondary | ICD-10-CM | POA: Diagnosis not present

## 2020-03-28 DIAGNOSIS — I2510 Atherosclerotic heart disease of native coronary artery without angina pectoris: Secondary | ICD-10-CM

## 2020-03-28 DIAGNOSIS — Z9861 Coronary angioplasty status: Secondary | ICD-10-CM

## 2020-03-28 DIAGNOSIS — Z720 Tobacco use: Secondary | ICD-10-CM | POA: Diagnosis not present

## 2020-03-28 NOTE — Progress Notes (Signed)
Cardiology Office Note   Date:  03/28/2020   ID:  Adam Farmer, Adam Farmer 24-May-1951, MRN [id number]  PCP:  Jani Gravel, MD    No chief complaint on file.  CAD  Wt Readings from Last 3 Encounters:  03/28/20 167 lb 3.2 oz (75.8 kg)  10/04/19 173 lb (78.5 kg)  06/28/19 164 lb 7.4 oz (74.6 kg)       History of Present Illness: Adam Farmer is a 69 y.o. male   with CAD who had a cath in 01/2019 when he presented with inferior STEMI. He had PCI of RCA and LAD at that time.  Cath showed:  "Prox Cx to Mid Cx lesion is 25% stenosed.  Dist LM lesion is 20% stenosed.  Ost LAD lesion is 25% stenosed.  Mid LAD-1 lesion is 99% stenosed.  A drug-eluting stent was successfully placed using a STENT SYNERGY DES 3X16.  Post intervention, there is a 0% residual stenosis.  Mid LAD-2 lesion is 75% stenosed, edge dissection.  A drug-eluting stent was successfully placed using a STENT SYNERGY DES 3X8 for the distal edge dissection, overlapping the 16 mm stent.  Post intervention, there is a 0% residual stenosis.  Mid RCA-1 lesion is 90% stenosed.  A drug-eluting stent was successfully placed using a STENT SYNERGY DES 4X28.  Post intervention, there is a 0% residual stenosis.  The left ventricular systolic function is normal.  LV end diastolic pressure is normal.  The left ventricular ejection fraction is 55-65% by visual estimate.  There is no aortic valve stenosis.  Continue aggressive secondary prevention.   Start Metoprolol and high dose atorvastatin."  He had a rash after leaving the hospital and meds were changed.Rash resolved when hydralazine was stopped.   Lisinopril stopped in 2/21 due to low BP.   He still has some dizziness when changing positions.  He has not fallen, but has been close. Metoprolol was changed to 12.5 mg qHS.  He got his COVID vaccines.   Past Medical History:  Diagnosis Date  . ABNORMAL GLUCOSE NEC 05/03/2007  . ANXIETY 05/03/2007   . COLONIC POLYPS, HX OF 07/09/2009  . HYPERLIPIDEMIA, MIXED 05/03/2007  . STEMI (ST elevation myocardial infarction) ([place])    01/16/19 PCI to LAD, RCA  . TOBACCO USE 05/03/2007    Past Surgical History:  Procedure Laterality Date  . CORONARY STENT INTERVENTION N/A 01/16/2019   Procedure: CORONARY STENT INTERVENTION;  Surgeon: Jettie Booze, MD;  Location: [place] [facility];  Service: Cardiovascular;  Laterality: N/A;  . CORONARY/GRAFT ACUTE MI REVASCULARIZATION N/A 01/16/2019   Procedure: Coronary/Graft Acute MI Revascularization;  Surgeon: Jettie Booze, MD;  Location: [employer] [facility];  Service: Cardiovascular;  Laterality: N/A;  . Fractured elbow, nose and toe    . LEFT HEART CATH AND CORONARY ANGIOGRAPHY N/A 01/16/2019   Procedure: LEFT HEART CATH AND CORONARY ANGIOGRAPHY;  Surgeon: Jettie Booze, MD;  Location: [place] [facility];  Service: Cardiovascular;  Laterality: N/A;  . WISDOM TOOTH EXTRACTION       Current Outpatient Medications  Medication Sig Dispense Refill  . aspirin EC 81 MG tablet Take 1 tablet (81 mg total) by mouth daily. 90 tablet 3  . atorvastatin (LIPITOR) 80 MG tablet TAKE 1 TABLET BY MOUTH EVERY DAY 90 tablet 2  . clopidogrel (PLAVIX) 75 MG tablet Take 4 tablets (300 mg total) on Day 1, then starting Day 2 take 1 tablet (75 mg total) once a day 94 tablet  3  . diphenhydrAMINE (BENADRYL ALLERGY) 25 MG tablet Take 25 mg ( 1 tablet) or 12.5 mg ( 1/2 tablet) every 4 to 6 hours as needed for rash 30 tablet 0  . escitalopram (LEXAPRO) 10 MG tablet Take 10 mg by mouth daily.    . metFORMIN (GLUCOPHAGE) 500 MG tablet Take 500 mg by mouth 2 (two) times daily with a meal.      . metoprolol tartrate (LOPRESSOR) 25 MG tablet Take 1 tablet by mouth twice daily 180 tablet 2  . metoprolol tartrate (LOPRESSOR) 25 MG tablet Take 12.5 mg by mouth at bedtime.    . nitroGLYCERIN (NITROSTAT) 0.4 MG SL tablet Place 1 tablet (0.4 mg total) under the tongue every 5  (five) minutes x 3 doses as needed for chest pain. 25 tablet 2   No current facility-administered medications for this visit.    Allergies:   Latex and Hydralazine    Social History:  The patient  reports that he has been smoking cigarettes. He has a 26.00 pack-year smoking history. He has never used smokeless tobacco. He reports current alcohol use of about 1.0 - 2.0 standard drink of alcohol per week. He reports that he does not use drugs.   Family History:  The patient's family history includes Colon polyps in his father; Diabetes in his father and paternal grandmother; Hyperlipidemia in his mother; Lung cancer in his father.    ROS:  Please see the history of present illness.   Otherwise, review of systems are positive for postural dizziness.   All other systems are reviewed and negative.    PHYSICAL EXAM: VS:  BP 112/78   Pulse 87   Ht 6' (1.829 m)   Wt 167 lb 3.2 oz (75.8 kg)   SpO2 95%   BMI 22.68 kg/m  , BMI Body mass index is 22.68 kg/m. GEN: Well nourished, well developed, in no acute distress  HEENT: normal  Neck: no JVD, carotid bruits, or masses Cardiac: RRR; no murmurs, rubs, or gallops,no edema  Respiratory:  clear to auscultation bilaterally, normal work of breathing GI: soft, nontender, nondistended, + BS MS: no deformity or atrophy  Skin: warm and dry, no rash Neuro:  Strength and sensation are intact Psych: euthymic mood, full affect   EKG:   The ekg ordered today demonstrates NSR, no ST changes   Recent Labs: 04/07/2019: ALT 25   Lipid Panel    Component Value Date/Time   CHOL 100 04/07/2019 1057   TRIG 208 (H) 04/07/2019 1057   HDL 35 (L) 04/07/2019 1057   CHOLHDL 2.9 04/07/2019 1057   CHOLHDL 4.5 01/17/2019 0635   VLDL 36 01/17/2019 0635   LDLCALC 23 04/07/2019 1057   LDLDIRECT 150.5 07/03/2009 1127     Other studies Reviewed: Additional studies/ records that were reviewed today with results demonstrating: LDL 45 in 7/21.   ASSESSMENT  AND PLAN:  1.   CAD/Old MI: No angina. COntinue aggressive secondary prevention.  Stop aspirin and continue clopidogrel since it is more than 1 year post PCI.  2.   Hyperlipidemia: The current medical regimen is effective;  continue present plan and medications. 3.   HTN: Low BP.  Metoprolol was reduced.  If it stays low, will stop metoprolol.   Tobacco abuse: He had stopped for a while but then started smoking again. He has not been able to cut back.  He will use nicorette gum.  Will screen for AAA w/ ultrasound.  Whole food, plant based diet.  Current medicines are reviewed at length with the patient today.  The patient concerns regarding his medicines were addressed.  The following changes have been made:  No change  Labs/ tests ordered today include:  No orders of the defined types were placed in this encounter.   Recommend 150 minutes/week of aerobic exercise Low fat, low carb, high fiber diet recommended  Disposition:   FU in 1 year   Signed, Larae Grooms, MD  03/28/2020 5:05 PM    [facility] [address], [place], [place]  [address] Phone: [phone]; Fax: [phone]

## 2020-03-28 NOTE — Patient Instructions (Addendum)
Medication Instructions:  Your physician has recommended you make the following change in your medication:   STOP: aspirin  *If you need a refill on your cardiac medications before your next appointment, please call your pharmacy*   Lab Work: None  If you have labs (blood work) drawn today and your tests are completely normal, you will receive your results only by: Marland Kitchen MyChart Message (if you have MyChart) OR . A paper copy in the mail If you have any lab test that is abnormal or we need to change your treatment, we will call you to review the results.   Testing/Procedures: Your physician has requested that you have an abdominal aorta duplex. During this test, an ultrasound is used to evaluate the aorta. Allow 30 minutes for this exam. Do not eat after midnight the day before and avoid carbonated beverages  Follow-Up: At [facility], you and your health needs are our priority.  As part of our continuing mission to provide you with exceptional heart care, we have created designated Provider Care Teams.  These Care Teams include your primary Cardiologist (physician) and Advanced Practice Providers (APPs -  Physician Assistants and Nurse Practitioners) who all work together to provide you with the care you need, when you need it.  We recommend signing up for the patient portal called "MyChart".  Sign up information is provided on this After Visit Summary.  MyChart is used to connect with patients for Virtual Visits (Telemedicine).  Patients are able to view lab/test results, encounter notes, upcoming appointments, etc.  Non-urgent messages can be sent to your provider as well.   To learn more about what you can do with MyChart, go to [email or web address].    Your next appointment:   12 month(s)  The format for your next appointment:   In Person  Provider:   You may see Larae Grooms, MD or one of the following Advanced Practice Providers on your designated Care Team:    Melina Copa, PA-C  Ermalinda Barrios, PA-C    Other Instructions None

## 2020-03-28 NOTE — Telephone Encounter (Signed)
This encounter was created in error - please disregard.

## 2020-04-23 ENCOUNTER — Other Ambulatory Visit: Payer: Self-pay

## 2020-04-23 ENCOUNTER — Ambulatory Visit (HOSPITAL_COMMUNITY)
Admission: RE | Admit: 2020-04-23 | Discharge: 2020-04-23 | Disposition: A | Discharge status: Home / Self Care | Payer: Medicare Other | Source: Ambulatory Visit | Attending: Internal Medicine | Admitting: Internal Medicine

## 2020-04-23 DIAGNOSIS — Z9861 Coronary angioplasty status: Secondary | ICD-10-CM | POA: Diagnosis present

## 2020-04-23 DIAGNOSIS — E785 Hyperlipidemia, unspecified: Secondary | ICD-10-CM | POA: Insufficient documentation

## 2020-04-23 DIAGNOSIS — I2510 Atherosclerotic heart disease of native coronary artery without angina pectoris: Secondary | ICD-10-CM | POA: Diagnosis present

## 2020-04-23 DIAGNOSIS — Z720 Tobacco use: Secondary | ICD-10-CM | POA: Diagnosis present

## 2020-04-23 DIAGNOSIS — I252 Old myocardial infarction: Secondary | ICD-10-CM | POA: Insufficient documentation

## 2020-07-13 ENCOUNTER — Ambulatory Visit: Payer: Medicare Other | Attending: Internal Medicine

## 2020-07-13 DIAGNOSIS — Z23 Encounter for immunization: Secondary | ICD-10-CM

## 2020-07-13 NOTE — Progress Notes (Signed)
   Covid-19 Vaccination Clinic  Name:  NAHUN KRONBERG    MRN: [id number] DOB: 11/13/1950  07/13/2020  Mr. Searls was observed post Covid-19 immunization for 15 minutes without incident. He was provided with Vaccine Information Sheet and instruction to access the V-Safe system.   Mr. Wenker was instructed to call 911 with any severe reactions post vaccine: Marland Kitchen Difficulty breathing  . Swelling of face and throat  . A fast heartbeat  . A bad rash all over body  . Dizziness and weakness   Immunizations Administered    No immunizations on file.

## 2020-07-23 ENCOUNTER — Other Ambulatory Visit: Payer: Self-pay | Admitting: Cardiology

## 2020-09-13 IMAGING — DX PORTABLE CHEST - 1 VIEW
1 series · 1 of 1 positions shown · non-contrast
Comparison: 10/12/2016

CLINICAL DATA: Chest pain

EXAM:
PORTABLE CHEST 1 VIEW

[chest ap]
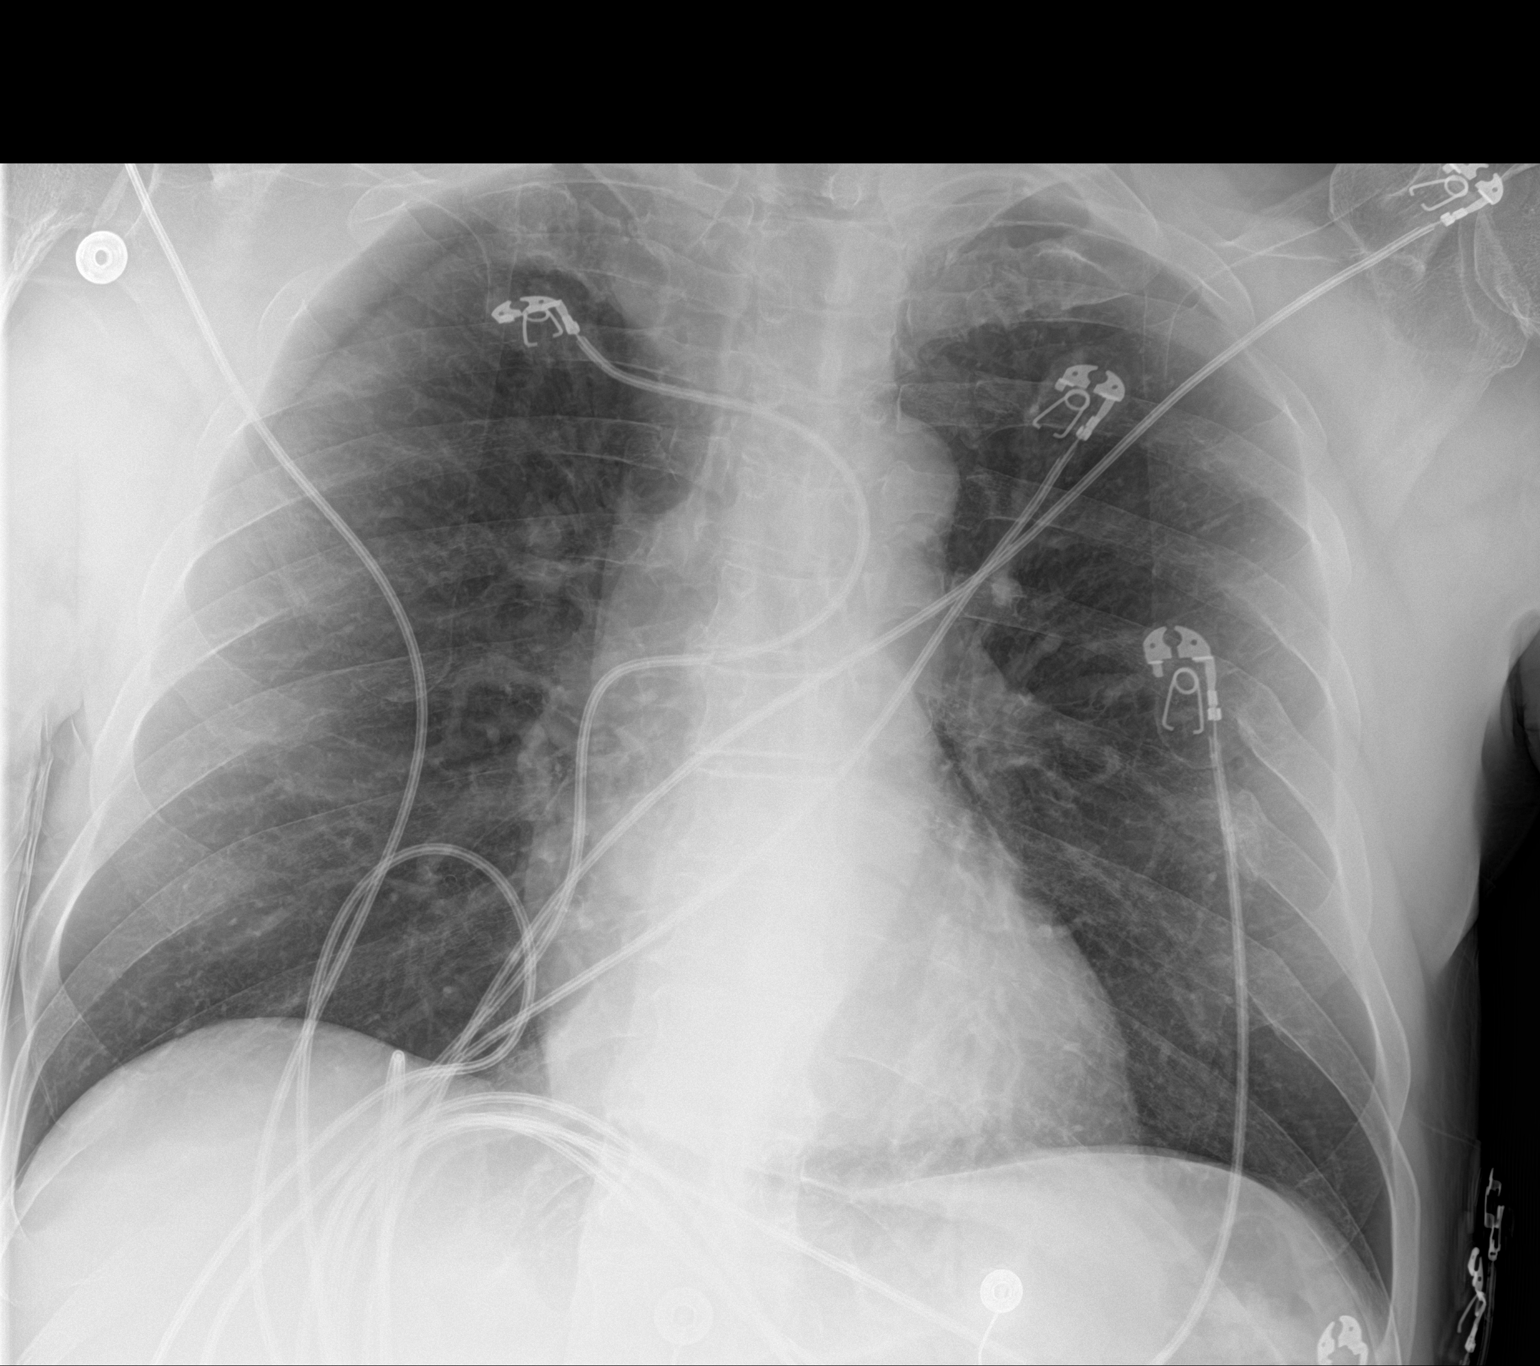

[1 of 1 positions shown; findings below may reference images not displayed]

FINDINGS: The heart size is normal. There are multiple old healed left-sided
rib fractures. There is a lucency coursing across the patient's
right upper thorax which is favored to be secondary to artifact,
much less likely pneumothorax. There is no large pleural effusion.
No focal area of consolidation. No acute osseous abnormality.
IMPRESSION: No active disease.

## 2020-10-25 ENCOUNTER — Other Ambulatory Visit: Payer: Self-pay | Admitting: Interventional Cardiology

## 2020-12-04 ENCOUNTER — Other Ambulatory Visit: Payer: Self-pay | Admitting: Family Medicine

## 2020-12-04 DIAGNOSIS — F17200 Nicotine dependence, unspecified, uncomplicated: Secondary | ICD-10-CM

## 2021-01-24 ENCOUNTER — Telehealth: Payer: Self-pay | Admitting: Interventional Cardiology

## 2021-01-24 MED ORDER — NITROGLYCERIN 0.4 MG SL SUBL: 0.4000 mg | SUBLINGUAL_TABLET | SUBLINGUAL | 2 refills | Status: DC | PRN

## 2021-01-24 NOTE — Telephone Encounter (Signed)
*  STAT* If patient is at the pharmacy, call can be transferred to refill team.   1. Which medications need to be refilled? (please list name of each medication and dose if known) nitroGLYCERIN (NITROSTAT) 0.4 MG SL tablet  2. Which pharmacy/location (including street and city if local pharmacy) is medication to be sent to? [place], [place]  3. Do they need a 30 day or 90 day supply? 30 day supply  Patient leaving town early in the morning tomorrow, medication is expired.

## 2021-03-24 ENCOUNTER — Other Ambulatory Visit: Payer: Self-pay

## 2021-03-24 ENCOUNTER — Ambulatory Visit
Admission: RE | Admit: 2021-03-24 | Discharge: 2021-03-24 | Disposition: A | Discharge status: Home / Self Care | Payer: Medicare Other | Source: Ambulatory Visit | Attending: Family Medicine | Admitting: Family Medicine

## 2021-03-24 DIAGNOSIS — F17200 Nicotine dependence, unspecified, uncomplicated: Secondary | ICD-10-CM

## 2021-04-30 ENCOUNTER — Other Ambulatory Visit: Payer: Self-pay | Admitting: Interventional Cardiology

## 2021-05-13 NOTE — Progress Notes (Signed)
Cardiology Office Note   Date:  05/14/2021   ID:  Adam Farmer, [name] [birth year], MRN [id number]  PCP:  Janie Morning, DO    No chief complaint on file.  CAD  Wt Readings from Last 3 Encounters:  05/14/21 168 lb 9.6 oz (76.5 kg)  03/28/20 167 lb 3.2 oz (75.8 kg)  10/04/19 173 lb (78.5 kg)       History of Present Illness: Adam Farmer is a 70 y.o. male  with CAD who had a cath in 01/2019 when he presented with inferior STEMI.  He had PCI of RCA and LAD at that time.   Cath showed: "Prox Cx to Mid Cx lesion is 25% stenosed. Dist LM lesion is 20% stenosed. Ost LAD lesion is 25% stenosed. Mid LAD-1 lesion is 99% stenosed. A drug-eluting stent was successfully placed using a STENT SYNERGY DES 3X16. Post intervention, there is a 0% residual stenosis. Mid LAD-2 lesion is 75% stenosed, edge dissection. A drug-eluting stent was successfully placed using a STENT SYNERGY DES 3X8 for the distal edge dissection, overlapping the 16 mm stent. Post intervention, there is a 0% residual stenosis. Mid RCA-1 lesion is 90% stenosed. A drug-eluting stent was successfully placed using a STENT SYNERGY DES 4X28. Post intervention, there is a 0% residual stenosis. The left ventricular systolic function is normal. LV end diastolic pressure is normal. The left ventricular ejection fraction is 55-65% by visual estimate. There is no aortic valve stenosis.   Continue aggressive secondary prevention.     Start Metoprolol and high dose atorvastatin."   He had a rash after leaving the hospital and meds were changed.   Rash resolved when hydralazine was stopped.    Lisinopril stopped in 2/21 due to low BP.    He still has some dizziness when changing positions.  He has not fallen, but has been close. Metoprolol was changed to 12.5 mg qHS.   He got his COVID vaccines.  Aortic atherosclerosis: No AAA in 9/21.   Watches several grandkids.   Denies : Chest pain. Dizziness. Leg edema.  Nitroglycerin use. Orthopnea. Palpitations. Paroxysmal nocturnal dyspnea. Shortness of breath. Syncope.    RIght hip pain, but not reason for his lack of exercise.    Past Medical History:  Diagnosis Date   ABNORMAL GLUCOSE NEC 05/03/2007   ANXIETY 05/03/2007   COLONIC POLYPS, HX OF 07/09/2009   HYPERLIPIDEMIA, MIXED 05/03/2007   STEMI (ST elevation myocardial infarction) ([place])    01/16/19 PCI to LAD, RCA   TOBACCO USE 05/03/2007    Past Surgical History:  Procedure Laterality Date   CORONARY STENT INTERVENTION N/A 01/16/2019   Procedure: CORONARY STENT INTERVENTION;  Surgeon: Jettie Booze, MD;  Location: [place] [facility];  Service: Cardiovascular;  Laterality: N/A;   CORONARY/GRAFT ACUTE MI REVASCULARIZATION N/A 01/16/2019   Procedure: Coronary/Graft Acute MI Revascularization;  Surgeon: Jettie Booze, MD;  Location: [place] [facility];  Service: Cardiovascular;  Laterality: N/A;   Fractured elbow, nose and toe     LEFT HEART CATH AND CORONARY ANGIOGRAPHY N/A 01/16/2019   Procedure: LEFT HEART CATH AND CORONARY ANGIOGRAPHY;  Surgeon: Jettie Booze, MD;  Location: [place] [facility];  Service: Cardiovascular;  Laterality: N/A;   WISDOM TOOTH EXTRACTION       Current Outpatient Medications  Medication Sig Dispense Refill   atorvastatin (LIPITOR) 80 MG tablet TAKE 1 TABLET BY MOUTH EVERY DAY 90 tablet 2   clopidogrel (PLAVIX) 75 MG tablet Take  1 tablet by mouth once daily 90 tablet 0   diphenhydrAMINE (BENADRYL ALLERGY) 25 MG tablet Take 25 mg ( 1 tablet) or 12.5 mg ( 1/2 tablet) every 4 to 6 hours as needed for rash 30 tablet 0   escitalopram (LEXAPRO) 10 MG tablet Take 10 mg by mouth daily.     metFORMIN (GLUCOPHAGE) 500 MG tablet Take 500 mg by mouth 2 (two) times daily with a meal.       metoprolol tartrate (LOPRESSOR) 25 MG tablet TAKE 1/2 (ONE-HALF) TABLET BY MOUTH AT BEDTIME 45 tablet 0   nitroGLYCERIN (NITROSTAT) 0.4 MG SL tablet Place 1 tablet (0.4 mg total)  under the tongue every 5 (five) minutes x 3 doses as needed for chest pain. 25 tablet 2   pioglitazone (ACTOS) 30 MG tablet 1 tablet     No current facility-administered medications for this visit.    Allergies:   Glimepiride, Latex, and Hydralazine    Social History:  The patient  reports that he has been smoking cigarettes. He has a 26.00 pack-year smoking history. He has never used smokeless tobacco. He reports current alcohol use of about 1.0 - 2.0 standard drink per week. He reports that he does not use drugs.   Family History:  The patient's family history includes Colon polyps in his father; Diabetes in his father and paternal grandmother; Hyperlipidemia in his mother; Lung cancer in his father.    ROS:  Please see the history of present illness.   Otherwise, review of systems are positive for hip pain.   All other systems are reviewed and negative.    PHYSICAL EXAM: VS:  BP 108/60   Pulse 83   Ht 6' (1.829 m)   Wt 168 lb 9.6 oz (76.5 kg)   SpO2 93%   BMI 22.87 kg/m  , BMI Body mass index is 22.87 kg/m. GEN: Well nourished, well developed, in no acute distress HEENT: normal Neck: no JVD, carotid bruits, or masses Cardiac: RRR; no murmurs, rubs, or gallops,no edema  Respiratory:  clear to auscultation bilaterally, normal work of breathing GI: soft, nontender, nondistended, + BS MS: no deformity or atrophy Skin: warm and dry, no rash Neuro:  Strength and sensation are intact Psych: euthymic mood, full affect   EKG:   The ekg ordered today demonstrates NSR, inferior Q waves, no ST changes   Recent Labs: No results found for requested labs within last 8760 hours.   Lipid Panel    Component Value Date/Time   CHOL 100 04/07/2019 1057   TRIG 208 (H) 04/07/2019 1057   HDL 35 (L) 04/07/2019 1057   CHOLHDL 2.9 04/07/2019 1057   CHOLHDL 4.5 01/17/2019 0635   VLDL 36 01/17/2019 0635   LDLCALC 23 04/07/2019 1057   LDLDIRECT 150.5 07/03/2009 1127     Other studies  Reviewed: Additional studies/ records that were reviewed today with results demonstrating: lab results.   ASSESSMENT AND PLAN:  CAD/Old MI: No angina. Clopidogrel monotherapy. Continue aggressive secondary prevention.  No bleeding problems Hyperlipidemia: LDL 64. Continue statin.  Whole food, plant based diet.  HTN:The current medical regimen is effective;  continue present plan and medications. Tobacco abuse: Still smoking.  Unable to quit. Discussed aids to stop smoking.  < 1 ppd.  He is willing to try the patch.  We will start with 14 mg NicoDerm patch. DM: A1C 7.2.  Metformin. Whole food, plant based diet.  Avoid processed foods. Aortic atherosclerosis: No AAA in 9/21.  Encouraged him to  get an updated COVID booster.  Wife had COVID but he did not get it.   Current medicines are reviewed at length with the patient today.  The patient concerns regarding his medicines were addressed.  The following changes have been made:  No change  Labs/ tests ordered today include:  No orders of the defined types were placed in this encounter.   Recommend 150 minutes/week of aerobic exercise Low fat, low carb, high fiber diet recommended  Disposition:   FU in 1 year   Signed, Larae Grooms, MD  05/14/2021 8:31 AM    [facility] [address], [place], [place]  [address] Phone: [phone]; Fax: [phone]

## 2021-05-14 ENCOUNTER — Other Ambulatory Visit: Payer: Self-pay

## 2021-05-14 ENCOUNTER — Encounter: Payer: Self-pay | Admitting: Interventional Cardiology

## 2021-05-14 ENCOUNTER — Ambulatory Visit (INDEPENDENT_AMBULATORY_CARE_PROVIDER_SITE_OTHER): Payer: Medicare Other | Admitting: Interventional Cardiology

## 2021-05-14 VITALS — BP 108/60 | HR 83 | Ht 72.0 in | Wt 168.6 lb

## 2021-05-14 DIAGNOSIS — I2510 Atherosclerotic heart disease of native coronary artery without angina pectoris: Secondary | ICD-10-CM | POA: Diagnosis not present

## 2021-05-14 DIAGNOSIS — Z9861 Coronary angioplasty status: Secondary | ICD-10-CM

## 2021-05-14 DIAGNOSIS — E785 Hyperlipidemia, unspecified: Secondary | ICD-10-CM

## 2021-05-14 DIAGNOSIS — Z720 Tobacco use: Secondary | ICD-10-CM

## 2021-05-14 DIAGNOSIS — I252 Old myocardial infarction: Secondary | ICD-10-CM | POA: Diagnosis not present

## 2021-05-14 MED ORDER — NICOTINE 14 MG/24HR TD PT24: 14.0000 mg | MEDICATED_PATCH | Freq: Every day | TRANSDERMAL | 0 refills | Status: DC

## 2021-05-14 NOTE — Patient Instructions (Signed)
Medication Instructions:  Your physician has recommended you make the following change in your medication: Start 14 mg nicotine patch daily  *If you need a refill on your cardiac medications before your next appointment, please call your pharmacy*   Lab Work: none If you have labs (blood work) drawn today and your tests are completely normal, you will receive your results only by: [place] (if you have MyChart) OR A paper copy in the mail If you have any lab test that is abnormal or we need to change your treatment, we will call you to review the results.   Testing/Procedures: none   Follow-Up: At [facility], you and your health needs are our priority.  As part of our continuing mission to provide you with exceptional heart care, we have created designated Provider Care Teams.  These Care Teams include your primary Cardiologist (physician) and Advanced Practice Providers (APPs -  Physician Assistants and Nurse Practitioners) who all work together to provide you with the care you need, when you need it.  We recommend signing up for the patient portal called "MyChart".  Sign up information is provided on this After Visit Summary.  MyChart is used to connect with patients for Virtual Visits (Telemedicine).  Patients are able to view lab/test results, encounter notes, upcoming appointments, etc.  Non-urgent messages can be sent to your provider as well.   To learn more about what you can do with MyChart, go to [email or web address].    Your next appointment:   12 month(s)  The format for your next appointment:   In Person  Provider:   You may see Larae Grooms, MD or one of the following Advanced Practice Providers on your designated Care Team:   Melina Copa, PA-C Ermalinda Barrios, PA-C   Other Instructions High-Fiber Eating Plan Fiber, also called dietary fiber, is a type of carbohydrate. It is found foods such as fruits, vegetables, whole grains, and beans. A  high-fiber diet can have many health benefits. Your health care provider may recommend a high-fiber diet to help: Prevent constipation. Fiber can make your bowel movements more regular. Lower your cholesterol. Relieve the following conditions: Inflammation of veins in the anus (hemorrhoids). Inflammation of specific areas of the digestive tract (uncomplicated diverticulosis). A problem of the large intestine, also called the colon, that sometimes causes pain and diarrhea (irritable bowel syndrome, or IBS). Prevent overeating as part of a weight-loss plan. Prevent heart disease, type 2 diabetes, and certain cancers. What are tips for following this plan? Reading food labels  Check the nutrition facts label on food products for the amount of dietary fiber. Choose foods that have 5 grams of fiber or more per serving. The goals for recommended daily fiber intake include: Men (age 30 or younger): 34-38 g. Men (over age 68): 28-34 g. Women (age 40 or younger): 25-28 g. Women (over age 44): 22-25 g. Your daily fiber goal is _____________ g. Shopping Choose whole fruits and vegetables instead of processed forms, such as apple juice or applesauce. Choose a wide variety of high-fiber foods such as avocados, lentils, oats, and kidney beans. Read the nutrition facts label of the foods you choose. Be aware of foods with added fiber. These foods often have high sugar and sodium amounts per serving. Cooking Use whole-grain flour for baking and cooking. Cook with brown rice instead of white rice. Meal planning Start the day with a breakfast that is high in fiber, such as a cereal that contains 5 g of fiber  or more per serving. Eat breads and cereals that are made with whole-grain flour instead of refined flour or white flour. Eat brown rice, bulgur wheat, or millet instead of white rice. Use beans in place of meat in soups, salads, and pasta dishes. Be sure that half of the grains you eat each day are  whole grains. General information You can get the recommended daily intake of dietary fiber by: Eating a variety of fruits, vegetables, grains, nuts, and beans. Taking a fiber supplement if you are not able to take in enough fiber in your diet. It is better to get fiber through food than from a supplement. Gradually increase how much fiber you consume. If you increase your intake of dietary fiber too quickly, you may have bloating, cramping, or gas. Drink plenty of water to help you digest fiber. Choose high-fiber snacks, such as berries, raw vegetables, nuts, and popcorn. What foods should I eat? Fruits Berries. Pears. Apples. Oranges. Avocado. Prunes and raisins. Dried figs. Vegetables Sweet potatoes. Spinach. Kale. Artichokes. Cabbage. Broccoli. Cauliflower. Green peas. Carrots. Squash. Grains Whole-grain breads. Multigrain cereal. Oats and oatmeal. Brown rice. Barley. Bulgur wheat. Van. Quinoa. Bran muffins. Popcorn. Rye wafer crackers. Meats and other proteins Navy beans, kidney beans, and pinto beans. Soybeans. Split peas. Lentils. Nuts and seeds. Dairy Fiber-fortified yogurt. Beverages Fiber-fortified soy milk. Fiber-fortified orange juice. Other foods Fiber bars. The items listed above may not be a complete list of recommended foods and beverages. Contact a dietitian for more information. What foods should I avoid? Fruits Fruit juice. Cooked, strained fruit. Vegetables Fried potatoes. Canned vegetables. Well-cooked vegetables. Grains White bread. Pasta made with refined flour. White rice. Meats and other proteins Fatty cuts of meat. Fried chicken or fried fish. Dairy Milk. Yogurt. Cream cheese. Sour cream. Fats and oils Butters. Beverages Soft drinks. Other foods Cakes and pastries. The items listed above may not be a complete list of foods and beverages to avoid. Talk with your dietitian about what choices are best for you. Summary Fiber is a type of  carbohydrate. It is found in foods such as fruits, vegetables, whole grains, and beans. A high-fiber diet has many benefits. It can help to prevent constipation, lower blood cholesterol, aid weight loss, and reduce your risk of heart disease, diabetes, and certain cancers. Increase your intake of fiber gradually. Increasing fiber too quickly may cause cramping, bloating, and gas. Drink plenty of water while you increase the amount of fiber you consume. The best sources of fiber include whole fruits and vegetables, whole grains, nuts, seeds, and beans. This information is not intended to replace advice given to you by your health care provider. Make sure you discuss any questions you have with your health care provider. Document Revised: 11/30/2019 Document Reviewed: 11/30/2019 Elsevier Patient Education  2022 [employer].

## 2021-08-01 ENCOUNTER — Other Ambulatory Visit: Payer: Self-pay | Admitting: Interventional Cardiology

## 2021-10-20 NOTE — Progress Notes (Unsigned)
Office Visit    Patient Name: Adam Farmer Date of Encounter: 10/20/2021  PCP:  Janie Morning, [facility]  Cardiologist:  Larae Grooms, MD  Advanced Practice Provider:  No care team member to display Electrophysiologist:  None    HPI    Adam Farmer is a 71 y.o. male with a hx of CAD with recent cardiac catheterization 01/2020 he presented for inferior STEMI (PCI of RCA and LAD), anxiety, tobacco use, hyperlipidemia presents today for follow-up visit.   Patient was last seen October 2022 and at that time was doing well from a cardiac standpoint.  He continued on Plavix monotherapy for recent stent.  He was still smoking at that time and a NicoDerm patch was prescribed.  A1c was elevated and dietary advice was given.  Today, he presents for his 1 year follow-up appointment ***  Past Medical History    Past Medical History:  Diagnosis Date   ABNORMAL GLUCOSE NEC 05/03/2007   ANXIETY 05/03/2007   COLONIC POLYPS, HX OF 07/09/2009   HYPERLIPIDEMIA, MIXED 05/03/2007   STEMI (ST elevation myocardial infarction) ([place])    01/16/19 PCI to LAD, RCA   TOBACCO USE 05/03/2007   Past Surgical History:  Procedure Laterality Date   CORONARY STENT INTERVENTION N/A 01/16/2019   Procedure: CORONARY STENT INTERVENTION;  Surgeon: Jettie Booze, MD;  Location: [place] [facility];  Service: Cardiovascular;  Laterality: N/A;   CORONARY/GRAFT ACUTE MI REVASCULARIZATION N/A 01/16/2019   Procedure: Coronary/Graft Acute MI Revascularization;  Surgeon: Jettie Booze, MD;  Location: [place] [facility];  Service: Cardiovascular;  Laterality: N/A;   Fractured elbow, nose and toe     LEFT HEART CATH AND CORONARY ANGIOGRAPHY N/A 01/16/2019   Procedure: LEFT HEART CATH AND CORONARY ANGIOGRAPHY;  Surgeon: Jettie Booze, MD;  Location: [place] [facility];  Service: Cardiovascular;  Laterality: N/A;   WISDOM TOOTH EXTRACTION      Allergies  Allergies   Allergen Reactions   Glimepiride Other (See Comments)   Latex     REACTION: itching   Hydralazine Rash    EKGs/Labs/Other Studies Reviewed:   The following studies were reviewed today:  Echocardiogram 01/17/2019  IMPRESSIONS     1. The left ventricle has normal systolic function, with an ejection  fraction of 55-60%. The cavity size was normal. Left ventricular diastolic  parameters were normal. No evidence of left ventricular regional wall  motion abnormalities.   2. The right ventricle has normal systolic function. The cavity was  normal. There is no increase in right ventricular wall thickness. Right  ventricular systolic pressure could not be assessed.   3. The aortic valve is tricuspid. Mild sclerosis of the aortic valve.   4. There is mild dilatation of the aortic root measuring 38 mm.   5. The inferior vena cava was dilated in size with >50% respiratory  variability.   EKG:  EKG is *** ordered today.  The ekg ordered today demonstrates ***  Recent Labs: No results found for requested labs within last 8760 hours.  Recent Lipid Panel    Component Value Date/Time   CHOL 100 04/07/2019 1057   TRIG 208 (H) 04/07/2019 1057   HDL 35 (L) 04/07/2019 1057   CHOLHDL 2.9 04/07/2019 1057   CHOLHDL 4.5 01/17/2019 0635   VLDL 36 01/17/2019 0635   LDLCALC 23 04/07/2019 1057   LDLDIRECT 150.5 07/03/2009 1127    Risk Assessment/Calculations:  {Does this patient  have ATRIAL FIBRILLATION?[phone]}  Home Medications   No outpatient medications have been marked as taking for the 10/21/21 encounter (Appointment) with Elgie Collard, PA-C.     Review of Systems   ***   All other systems reviewed and are otherwise negative except as noted above.  Physical Exam    VS:  There were no vitals taken for this visit. , BMI There is no height or weight on file to calculate BMI.  Wt Readings from Last 3 Encounters:  05/14/21 168 lb 9.6 oz (76.5 kg)  03/28/20 167 lb 3.2 oz (75.8  kg)  10/04/19 173 lb (78.5 kg)     GEN: Well nourished, well developed, in no acute distress. HEENT: normal. Neck: Supple, no JVD, carotid bruits, or masses. Cardiac: ***RRR, no murmurs, rubs, or gallops. No clubbing, cyanosis, edema.  ***Radials/PT 2+ and equal bilaterally.  Respiratory:  ***Respirations regular and unlabored, clear to auscultation bilaterally. GI: Soft, nontender, nondistended. MS: No deformity or atrophy. Skin: Warm and dry, no rash. Neuro:  Strength and sensation are intact. Psych: Normal affect.  Assessment & Plan    CAD/Old MI  Hyperlipidemia  Hypertension  Tobacco abuse  DM   Aortic atherosclerosis       Disposition: Follow up {follow up:15908} with Larae Grooms, MD or APP.  Signed, Elgie Collard, PA-C 10/20/2021, 1:02 PM [place]

## 2021-10-21 ENCOUNTER — Encounter: Payer: Self-pay | Admitting: Physician Assistant

## 2021-10-21 ENCOUNTER — Ambulatory Visit (INDEPENDENT_AMBULATORY_CARE_PROVIDER_SITE_OTHER): Payer: Medicare Other | Admitting: Physician Assistant

## 2021-10-21 ENCOUNTER — Other Ambulatory Visit: Payer: Self-pay

## 2021-10-21 VITALS — BP 100/64 | HR 93 | Ht 71.0 in | Wt 167.8 lb

## 2021-10-21 DIAGNOSIS — E118 Type 2 diabetes mellitus with unspecified complications: Secondary | ICD-10-CM

## 2021-10-21 DIAGNOSIS — I2510 Atherosclerotic heart disease of native coronary artery without angina pectoris: Secondary | ICD-10-CM | POA: Diagnosis not present

## 2021-10-21 DIAGNOSIS — I10 Essential (primary) hypertension: Secondary | ICD-10-CM

## 2021-10-21 DIAGNOSIS — E785 Hyperlipidemia, unspecified: Secondary | ICD-10-CM | POA: Diagnosis not present

## 2021-10-21 DIAGNOSIS — I252 Old myocardial infarction: Secondary | ICD-10-CM

## 2021-10-21 DIAGNOSIS — Z720 Tobacco use: Secondary | ICD-10-CM

## 2021-10-21 DIAGNOSIS — R5383 Other fatigue: Secondary | ICD-10-CM

## 2021-10-21 DIAGNOSIS — I700 Atherosclerosis of aorta: Secondary | ICD-10-CM

## 2021-10-21 LAB — COMPREHENSIVE METABOLIC PANEL
ALT: 27 IU/L (ref 0–44)
AST: 16 IU/L (ref 0–40)
Albumin/Globulin Ratio: 2 (ref 1.2–2.2)
Albumin: 4.5 g/dL (ref 3.8–4.8)
Alkaline Phosphatase: 74 IU/L (ref 44–121)
BUN/Creatinine Ratio: 14 (ref 10–24)
BUN: 15 mg/dL (ref 8–27)
Bilirubin Total: 0.3 mg/dL (ref 0.0–1.2)
CO2: 28 mmol/L (ref 20–29)
Calcium: 10 mg/dL (ref 8.6–10.2)
Chloride: 97 mmol/L (ref 96–106)
Creatinine, Ser: 1.08 mg/dL (ref 0.76–1.27)
Globulin, Total: 2.3 g/dL (ref 1.5–4.5)
Glucose: 126 mg/dL — ABNORMAL HIGH (ref 70–99)
Potassium: 4.3 mmol/L (ref 3.5–5.2)
Sodium: 136 mmol/L (ref 134–144)
Total Protein: 6.8 g/dL (ref 6.0–8.5)
eGFR: 75 mL/min/{1.73_m2} (ref >59)

## 2021-10-21 LAB — CBC
Hematocrit: 47.5 % (ref 37.5–51.0)
Hemoglobin: 16.5 g/dL (ref 13.0–17.7)
MCH: 31.4 pg (ref 26.6–33.0)
MCHC: 34.7 g/dL (ref 31.5–35.7)
MCV: 90 fL (ref 79–97)
Platelets: 230 10*3/uL (ref 150–450)
RBC: 5.26 x10E6/uL (ref 4.14–5.80)
RDW: 12.5 % (ref 11.6–15.4)
WBC: 8.4 10*3/uL (ref 3.4–10.8)

## 2021-10-21 NOTE — Patient Instructions (Signed)
Medication Instructions:  ?Your physician recommends that you continue on your current medications as directed. Please refer to the Current Medication list given to you today.  ?*If you need a refill on your cardiac medications before your next appointment, please call your pharmacy* ? ? ?Lab Work: ?CBC and CMP today ?If you have labs (blood work) drawn today and your tests are completely normal, you will receive your results only by: ?MyChart Message (if you have MyChart) OR ?A paper copy in the mail ?If you have any lab test that is abnormal or we need to change your treatment, we will call you to review the results. ? ? ?Testing/Procedures: ?Your physician has requested that you have an echocardiogram. Echocardiography is a painless test that uses sound waves to create images of your heart. It provides your doctor with information about the size and shape of your heart and how well your heart?s chambers and valves are working. This procedure takes approximately one hour. There are no restrictions for this procedure.  ? ? ?Follow-Up: ?At [facility], you and your health needs are our priority.  As part of our continuing mission to provide you with exceptional heart care, we have created designated Provider Care Teams.  These Care Teams include your primary Cardiologist (physician) and Advanced Practice Providers (APPs -  Physician Assistants and Nurse Practitioners) who all work together to provide you with the care you need, when you need it. ? ? ?Your next appointment:   ?1 year(s) ? ?The format for your next appointment:   ?In Person ? ?Provider:   ?Larae Grooms, MD { ? ?Other Instructions ?1.Increase hydration to 64 oz daily and limit caffiene ?2.Check your blood pressure daily one hour after taking your morning medications and send [place] the readings through your MyChart or call our office  ?

## 2021-11-05 ENCOUNTER — Ambulatory Visit (HOSPITAL_COMMUNITY): Payer: Medicare Other | Attending: Cardiology

## 2021-11-05 ENCOUNTER — Other Ambulatory Visit: Payer: Self-pay

## 2021-11-05 DIAGNOSIS — R5383 Other fatigue: Secondary | ICD-10-CM | POA: Insufficient documentation

## 2021-11-05 DIAGNOSIS — I2510 Atherosclerotic heart disease of native coronary artery without angina pectoris: Secondary | ICD-10-CM | POA: Diagnosis not present

## 2021-11-05 DIAGNOSIS — I252 Old myocardial infarction: Secondary | ICD-10-CM | POA: Insufficient documentation

## 2021-11-05 DIAGNOSIS — I10 Essential (primary) hypertension: Secondary | ICD-10-CM | POA: Insufficient documentation

## 2021-11-05 DIAGNOSIS — E785 Hyperlipidemia, unspecified: Secondary | ICD-10-CM | POA: Diagnosis not present

## 2021-11-05 DIAGNOSIS — I361 Nonrheumatic tricuspid (valve) insufficiency: Secondary | ICD-10-CM | POA: Diagnosis not present

## 2021-11-05 DIAGNOSIS — F17200 Nicotine dependence, unspecified, uncomplicated: Secondary | ICD-10-CM | POA: Insufficient documentation

## 2021-11-05 LAB — ECHOCARDIOGRAM COMPLETE
Area-P 1/2: 3.1 cm2
S' Lateral: 3.1 cm

## 2021-12-31 ENCOUNTER — Telehealth: Payer: Self-pay | Admitting: *Deleted

## 2021-12-31 NOTE — Telephone Encounter (Signed)
Doing recalls and noted in chart  pt is on Plavix and has had some recent cardio testing- due to Plavix pt will need an OV prior to scheduling a colon- called pt, LM to return call to schedule an OV with either a APP or Dr Loletha Carrow due to the Plavix prior to having a colonoscopy scheduled   Lelan Pons PV

## 2022-01-01 ENCOUNTER — Encounter: Payer: Self-pay | Admitting: Gastroenterology

## 2022-01-27 ENCOUNTER — Encounter: Payer: Self-pay | Admitting: Gastroenterology

## 2022-01-27 ENCOUNTER — Ambulatory Visit (INDEPENDENT_AMBULATORY_CARE_PROVIDER_SITE_OTHER): Payer: Medicare Other | Admitting: Gastroenterology

## 2022-01-27 VITALS — BP 118/76 | HR 87 | Resp 16 | Ht 71.5 in | Wt 166.0 lb

## 2022-01-27 DIAGNOSIS — Z7902 Long term (current) use of antithrombotics/antiplatelets: Secondary | ICD-10-CM

## 2022-01-27 DIAGNOSIS — Z860100 Personal history of colon polyps, unspecified: Secondary | ICD-10-CM

## 2022-01-27 DIAGNOSIS — I2510 Atherosclerotic heart disease of native coronary artery without angina pectoris: Secondary | ICD-10-CM | POA: Diagnosis not present

## 2022-01-27 DIAGNOSIS — Z800 Family history of malignant neoplasm of digestive organs: Secondary | ICD-10-CM | POA: Diagnosis not present

## 2022-01-27 MED ORDER — PLENVU 140 G PO SOLR: 140.0000 g | ORAL | 0 refills | Status: DC

## 2022-01-27 NOTE — Progress Notes (Signed)
[facility] Consult Note:  History: Adam Farmer 01/27/2022  Referring provider: Janie Morning, DO  Reason for consult/chief complaint: Colonoscopy (Pt states Last colonoscopy may 2018 and he is due for another one. )   Subjective  HPI:  This is a 71 year old male known to me from previous colonoscopy here for medical evaluation prior to surveillance colonoscopy.  8 to 10 mm cecal tubular adenoma removed last colonoscopy with me in May 2018.  His father also had colon cancer.  Since I last saw him, he had an MI in 2021 requiring stent placement with details noted below in cardiology office note.  His bowel habits are regular, he denies rectal bleeding or abdominal pain.  He denies chronic upper digestive symptoms. ROS:  Review of Systems  Constitutional:  Positive for fatigue. Negative for appetite change and unexpected weight change.  HENT:  Negative for mouth sores and voice change.   Eyes:  Negative for pain and redness.  Respiratory:  Negative for cough and shortness of breath.   Cardiovascular:  Negative for chest pain and palpitations.  Genitourinary:  Negative for dysuria and hematuria.  Musculoskeletal:  Negative for arthralgias and myalgias.  Skin:  Negative for pallor and rash.  Neurological:  Negative for weakness and headaches.  Hematological:  Negative for adenopathy.     Past Medical History: Past Medical History:  Diagnosis Date   ABNORMAL GLUCOSE NEC 05/03/2007   ANXIETY 05/03/2007   COLONIC POLYPS, HX OF 07/09/2009   HYPERLIPIDEMIA, MIXED 05/03/2007   STEMI (ST elevation myocardial infarction) ([place])    01/16/19 PCI to LAD, RCA   TOBACCO USE 05/03/2007   Most recent cardiology office note from March 2023 including the following: "JIANNI Farmer is a 71 y.o. male with a hx of CAD with recent cardiac catheterization 01/2020 he presented for inferior STEMI (PCI of RCA and LAD), anxiety, tobacco use, hyperlipidemia presents today for  follow-up visit.    Patient was last seen October 2022 and at that time was doing well from a cardiac standpoint.  He continued on Plavix monotherapy for recent stent.  He was still smoking at that time and a NicoDerm patch was prescribed.  A1c was elevated and dietary advice was given.   Today, he presents for his 1 year follow-up appointment.  Overall, he is doing pretty good from a cardiac standpoint.  He has not had any chest pain which was his presenting symptom when he had his heart attack back in 2021.  He does state that he is feeling tired all the time and wants to sleep all the time.  This started a few months ago.  He also states that he has some numbness in his hands when driving or if he is laying in a certain position.  He states that he has not been exercising much because of the fatigue and still continues to smoke over a pack a day.  He has 6 grandkids that keeps him busy and he enjoys playing golf from time to time.  We discussed several options to work-up his fatigue from a cardiac standpoint.  We also discussed his blood pressure cuff since its a wrist cuff is going to read a little bit higher.  He did endorse some positional dizziness/lightheadedness.  This sounds like orthostatic hypotension and he was encouraged to decrease his caffeine intake and increase his water intake.   Reports no shortness of breath nor dyspnea on exertion. Reports no chest pain, pressure, or tightness. No edema,  orthopnea, PND. Reports no palpitations.   Past Surgical History: Past Surgical History:  Procedure Laterality Date   CORONARY STENT INTERVENTION N/A 01/16/2019   Procedure: CORONARY STENT INTERVENTION;  Surgeon: Jettie Booze, MD;  Location: [facility];  Service: Cardiovascular;  Laterality: N/A;   CORONARY/GRAFT ACUTE MI REVASCULARIZATION N/A 01/16/2019   Procedure: Coronary/Graft Acute MI Revascularization;  Surgeon: Jettie Booze, MD;  Location: [facility];  Service:  Cardiovascular;  Laterality: N/A;   Fractured elbow, nose and toe     LEFT HEART CATH AND CORONARY ANGIOGRAPHY N/A 01/16/2019   Procedure: LEFT HEART CATH AND CORONARY ANGIOGRAPHY;  Surgeon: Jettie Booze, MD;  Location: [facility];  Service: Cardiovascular;  Laterality: N/A;   WISDOM TOOTH EXTRACTION       Family History: Family History  Problem Relation Age of Onset   Hyperlipidemia Mother    Colon cancer Father    Colon polyps Father    Lung cancer Father    Diabetes Father    Diabetes Paternal Grandmother    Cancer Neg Hx    Heart disease Neg Hx    Liver cancer Neg Hx    Pancreatic cancer Neg Hx    Rectal cancer Neg Hx    Stomach cancer Neg Hx     Social History: Social History   Socioeconomic History   Marital status: Married    Spouse name: Not on file   Number of children: 2   Years of education: 12   Highest education level: Not on file  Occupational History   Occupation: trucking Therapist, nutritional  Tobacco Use   Smoking status: Every Day    Packs/day: 1.00    Years: 26.00    Total pack years: 26.00    Types: Cigarettes   Smokeless tobacco: Never   Tobacco comments:    discussed strategy  Substance and Sexual Activity   Alcohol use: Yes    Alcohol/week: 1.0 - 2.0 standard drink of alcohol    Types: 1 - 2 Standard drinks or equivalent per week   Drug use: No   Sexual activity: Yes    Partners: Female  Other Topics Concern   Not on file  Social History Narrative   Married- '76 (dated since she was 36). Son-'86, graduated [place]  In [place]; daughter-'81 -  MSW, hospital Education officer, museum. Wife-fair health: HTN, torn retina. 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 Now on the road 2 weeks/month.    Social Determinants of Health   Financial Resource Strain: Not on file  Food Insecurity: Not on file  Transportation Needs: Not on file  Physical Activity: Not on file  Stress: Not on file  Social Connections: Not on file     Allergies: Allergies  Allergen Reactions   Glimepiride Other (See Comments)   Latex     REACTION: itching   Hydralazine Rash    Outpatient Meds: Current Outpatient Medications  Medication Sig Dispense Refill   atorvastatin (LIPITOR) 80 MG tablet TAKE 1 TABLET BY MOUTH EVERY DAY 90 tablet 2   clopidogrel (PLAVIX) 75 MG tablet Take 1 tablet by mouth once daily 90 tablet 3   diphenhydrAMINE (BENADRYL ALLERGY) 25 MG tablet Take 25 mg ( 1 tablet) or 12.5 mg ( 1/2 tablet) every 4 to 6 hours as needed for rash 30 tablet 0   escitalopram (LEXAPRO) 10 MG tablet Take 10 mg by mouth daily.     metFORMIN (GLUCOPHAGE-XR) 500 MG 24 hr tablet 1  tablet in the morning with a meal and 2 tablets with evening meal     metoprolol tartrate (LOPRESSOR) 25 MG tablet TAKE 1/2 (ONE-HALF) TABLET BY MOUTH AT BEDTIME 45 tablet 3   nitroGLYCERIN (NITROSTAT) 0.4 MG SL tablet Place 1 tablet (0.4 mg total) under the tongue every 5 (five) minutes x 3 doses as needed for chest pain. 25 tablet 2   pioglitazone (ACTOS) 30 MG tablet 1 tablet     No current facility-administered medications for this visit.      ___________________________________________________________________ Objective   Exam:  BP 118/76 (BP Location: Left Arm, Patient Position: Sitting, Cuff Size: Normal)   Pulse 87   Resp 16   Ht 5' 11.5" (1.816 m)   Wt 166 lb (75.3 kg)   SpO2 96%   BMI 22.83 kg/m  Wt Readings from Last 3 Encounters:  01/27/22 166 lb (75.3 kg)  10/21/21 167 lb 12.8 oz (76.1 kg)  05/14/21 168 lb 9.6 oz (76.5 kg)   (Smells of cigarettes) General: Well-appearing Eyes: sclera anicteric, no redness ENT: oral mucosa moist without lesions, no cervical or supraclavicular lymphadenopathy CV: Regular without murmur, no JVD, no peripheral edema.  Good distal pulses Resp: clear to auscultation bilaterally, normal RR and effort noted GI: soft, no tenderness, with active bowel sounds. No guarding or palpable organomegaly  noted. Skin; warm and dry, no rash or jaundice noted Neuro: awake, alert and oriented x 3. Normal gross motor function and fluent speech  Data: Echocardiogram 11/04/2021  1. Left ventricular ejection fraction by 3D volume is 61 %. The left  ventricle has normal function. The left ventricle has no regional wall  motion abnormalities. Left ventricular diastolic parameters were normal.   2. Right ventricular systolic function is normal. The right ventricular  size is normal. There is normal pulmonary artery systolic pressure.   3. The mitral valve is normal in structure. No evidence of mitral valve  regurgitation. No evidence of mitral stenosis.   4. The aortic valve is normal in structure. Aortic valve regurgitation is  not visualized. No aortic stenosis is present.   5. There is mild dilatation of the aortic root, measuring 38 mm.   6. The inferior vena cava is normal in size with greater than 50%  respiratory variability, suggesting right atrial pressure of 3 mmHg.   Comparison(s): A prior study was performed on 01/17/2019. No significant  change from prior study.    Assessment: Encounter Diagnoses  Name Primary?   Personal history of colonic polyps Yes   Long term (current) use of antithrombotics/antiplatelets    Family history of colon cancer in father   Coronary artery disease with prior MI and stent placement  Personal history adenomatous colon polyp and family history colon cancer.  He is due for surveillance colonoscopy and was agreeable after discussion of procedure and risks.  The benefits and risks of the planned procedure were described in detail with the patient or (when appropriate) their health care proxy.  Risks were outlined as including, but not limited to, bleeding, infection, perforation, adverse medication reaction leading to cardiac or pulmonary decompensation, pancreatitis (if ERCP).  The limitation of incomplete mucosal visualization was also discussed.  No  guarantees or warranties were given.  Patient at increased risk for cardiopulmonary complications of procedure due to medical comorbidities.  Hold Plavix 5 days before procedure and confer with cardiology to be sure they are agreeable.  Coronary intervention was 2 years ago. Fidel understands the small but real risk of  cardiovascular event during the Plavix hold, and he will take aspirin 81 mg daily during the Plavix hold.   Thank you for the courtesy of this consult.  Please call me with any questions or concerns.  Nelida Meuse III  CC: Referring provider noted above

## 2022-01-27 NOTE — Patient Instructions (Signed)
If you are age 68 or older, your body mass index should be between 23-30. Your Body mass index is 22.83 kg/m. If this is out of the aforementioned range listed, please consider follow up with your Primary Care Provider.  If you are age 94 or younger, your body mass index should be between 19-25. Your Body mass index is 22.83 kg/m. If this is out of the aformentioned range listed, please consider follow up with your Primary Care Provider.   ________________________________________________________  The [facility] providers would like to encourage you to use [facility] to communicate with providers for non-urgent requests or questions.  Due to long hold times on the telephone, sending your provider a message by [facility] may be a faster and more efficient way to get a response.  Please allow 48 business hours for a response.  Please remember that this is for non-urgent requests.  _______________________________________________________  Dennis Bast will be contacted by our office prior to your procedure for directions on holding your Plavix.  If you do not hear from our office 1 week prior to your scheduled procedure, please call [phone] to discuss.    Due to recent changes in healthcare laws, you may see the results of your imaging and laboratory studies on MyChart before your provider has had a chance to review them.  We understand that in some cases there may be results that are confusing or concerning to you. Not all laboratory results come back in the same time frame and the provider may be waiting for multiple results in order to interpret others.  Please give [place] 48 hours in order for your provider to thoroughly review all the results before contacting the office for clarification of your results.    It was a pleasure to see you today!  Thank you for trusting me with your gastrointestinal care!

## 2022-01-30 ENCOUNTER — Telehealth: Payer: Self-pay

## 2022-01-30 NOTE — Telephone Encounter (Signed)
OK to hold Plavix 5 days prior to colonoscopy.  

## 2022-02-02 NOTE — Telephone Encounter (Signed)
Patient has been notified and aware. No additional questions at this time.   

## 2022-03-16 ENCOUNTER — Encounter: Payer: Medicare Other | Admitting: Gastroenterology

## 2022-03-27 ENCOUNTER — Other Ambulatory Visit: Payer: Self-pay

## 2022-03-27 DIAGNOSIS — F17200 Nicotine dependence, unspecified, uncomplicated: Secondary | ICD-10-CM

## 2022-03-27 DIAGNOSIS — R911 Solitary pulmonary nodule: Secondary | ICD-10-CM

## 2022-04-09 ENCOUNTER — Ambulatory Visit (AMBULATORY_SURGERY_CENTER): Payer: Medicare Other | Admitting: Gastroenterology

## 2022-04-09 ENCOUNTER — Encounter: Payer: Self-pay | Admitting: Gastroenterology

## 2022-04-09 VITALS — BP 105/71 | HR 65 | Temp 97.1°F | Resp 20 | Ht 71.0 in | Wt 166.0 lb

## 2022-04-09 DIAGNOSIS — Z09 Encounter for follow-up examination after completed treatment for conditions other than malignant neoplasm: Secondary | ICD-10-CM | POA: Diagnosis not present

## 2022-04-09 DIAGNOSIS — Z86010 Personal history of colonic polyps: Secondary | ICD-10-CM

## 2022-04-09 DIAGNOSIS — D128 Benign neoplasm of rectum: Secondary | ICD-10-CM

## 2022-04-09 DIAGNOSIS — D122 Benign neoplasm of ascending colon: Secondary | ICD-10-CM | POA: Diagnosis not present

## 2022-04-09 DIAGNOSIS — Z800 Family history of malignant neoplasm of digestive organs: Secondary | ICD-10-CM | POA: Diagnosis not present

## 2022-04-09 MED ORDER — SODIUM CHLORIDE 0.9 % IV SOLN: 500.0000 mL | Freq: Once | INTRAVENOUS | Status: DC

## 2022-04-09 NOTE — Op Note (Addendum)
[facility] Patient Name: Adam Farmer Procedure Date: 04/09/2022 10:53 AM MRN: [id number] Endoscopist: Mallie Mussel L. Loletha Carrow , MD Age: 71 Referring MD:  Date of Birth: Dec 23, 1950 Gender: Male Account #: [id number] Procedure:                Colonoscopy Indications:              Colon cancer screening in patient at increased                            risk: Colorectal cancer in father, Surveillance:                            Personal history of adenomatous polyps on last                            colonoscopy 5 years ago                           8-50m TA last colonoscopy May 2018 Medicines:                Monitored Anesthesia Care Procedure:                Pre-Anesthesia Assessment:                           - Prior to the procedure, a History and Physical                            was performed, and patient medications and                            allergies were reviewed. The patient's tolerance of                            previous anesthesia was also reviewed. The risks                            and benefits of the procedure and the sedation                            options and risks were discussed with the patient.                            All questions were answered, and informed consent                            was obtained. Prior Anticoagulants: The patient has                            taken Plavix (clopidogrel), last dose was 5 days                            prior to procedure. ASA Grade Assessment: III - A  patient with severe systemic disease. After                            reviewing the risks and benefits, the patient was                            deemed in satisfactory condition to undergo the                            procedure.                           After obtaining informed consent, the colonoscope                            was passed under direct vision. Throughout the                            procedure, the  patient's blood pressure, pulse, and                            oxygen saturations were monitored continuously. The                            Colonoscope was introduced through the anus and                            advanced to the the cecum, identified by                            appendiceal orifice and ileocecal valve. The                            colonoscopy was performed without difficulty. The                            patient tolerated the procedure well. The quality                            of the bowel preparation was good. The ileocecal                            valve, appendiceal orifice, and rectum were                            photographed. The bowel preparation used was Plenvu. Scope In: 11:03:41 AM Scope Out: 11:28:05 AM Scope Withdrawal Time: 0 hours 19 minutes 51 seconds  Total Procedure Duration: 0 hours 24 minutes 24 seconds  Findings:                 The perianal and digital rectal examinations were                            normal.  Two sessile and semi-sessile polyps were found in                            the rectum and ascending colon. The polyps were 4                            to 5 mm in size. These polyps were removed with a                            cold snare. Resection and retrieval were complete.                           Repeat examination of right colon under NBI                            performed.                           Internal hemorrhoids were found. The hemorrhoids                            were small.                           The exam was otherwise without abnormality on                            direct and retroflexion views. Complications:            No immediate complications. Estimated Blood Loss:     Estimated blood loss was minimal. Impression:               - Two 4 to 5 mm polyps in the rectum and in the                            ascending colon, removed with a cold snare.                             Resected and retrieved.                           - Internal hemorrhoids.                           - The examination was otherwise normal on direct                            and retroflexion views. Recommendation:           - Patient has a contact number available for                            emergencies. The signs and symptoms of potential                            delayed complications were discussed with  the                            patient. Return to normal activities tomorrow.                            Written discharge instructions were provided to the                            patient.                           - Resume previous diet.                           - Continue present medications. Resume plavix                            tomorrow.                           - Await pathology results.                           - Repeat colonoscopy in 5 years for surveillance                            anf family history. Nyanna Heideman L. Loletha Carrow, MD 04/09/2022 11:32:14 AM This report has been signed electronically.

## 2022-04-09 NOTE — Patient Instructions (Signed)
Resume Plavix tomorrow 04/10/2022    YOU HAD AN ENDOSCOPIC PROCEDURE TODAY: Refer to the procedure report and other information in the discharge instructions given to you for any specific questions about what was found during the examination. If this information does not answer your questions, please call [facility] office at [phone] to clarify.   YOU SHOULD EXPECT: Some feelings of bloating in the abdomen. Passage of more gas than usual. Walking can help get rid of the air that was put into your GI tract during the procedure and reduce the bloating. If you had a lower endoscopy (such as a colonoscopy or flexible sigmoidoscopy) you may notice spotting of blood in your stool or on the toilet paper. Some abdominal soreness may be present for a day or two, also.  DIET: Your first meal following the procedure should be a light meal and then it is ok to progress to your normal diet. A half-sandwich or bowl of soup is an example of a good first meal. Heavy or fried foods are harder to digest and may make you feel nauseous or bloated. Drink plenty of fluids but you should avoid alcoholic beverages for 24 hours. If you had a esophageal dilation, please see attached instructions for diet.    ACTIVITY: Your care partner should take you home directly after the procedure. You should plan to take it easy, moving slowly for the rest of the day. You can resume normal activity the day after the procedure however YOU SHOULD NOT DRIVE, use power tools, machinery or perform tasks that involve climbing or major physical exertion for 24 hours (because of the sedation medicines used during the test).   SYMPTOMS TO REPORT IMMEDIATELY: A gastroenterologist can be reached at any hour. Please call [phone]  for any of the following symptoms:  Following lower endoscopy (colonoscopy, flexible sigmoidoscopy) Excessive amounts of blood in the stool  Significant tenderness, worsening of abdominal pains  Swelling of the  abdomen that is new, acute  Fever of 100 or higher  Following upper endoscopy (EGD, EUS, ERCP, esophageal dilation) Vomiting of blood or coffee ground material  New, significant abdominal pain  New, significant chest pain or pain under the shoulder blades  Painful or persistently difficult swallowing  New shortness of breath  Black, tarry-looking or red, bloody stools  FOLLOW UP:  If any biopsies were taken you will be contacted by phone or by letter within the next 1-3 weeks. Call [phone]  if you have not heard about the biopsies in 3 weeks.  Please also call with any specific questions about appointments or follow up tests. YOU HAD AN ENDOSCOPIC PROCEDURE TODAY: Refer to the procedure report and other information in the discharge instructions given to you for any specific questions about what was found during the examination. If this information does not answer your questions, please call [facility] office at [phone] to clarify.

## 2022-04-09 NOTE — Progress Notes (Signed)
Called to room to assist during endoscopic procedure.  Patient ID and intended procedure confirmed with present staff. Received instructions for my participation in the procedure from the performing physician.  

## 2022-04-09 NOTE — Progress Notes (Signed)
VS by EW

## 2022-04-09 NOTE — Progress Notes (Signed)
History and Physical:  This patient presents for endoscopic testing for: Encounter Diagnosis  Name Primary?   Personal history of colonic polyps Yes    Last office visit 01/27/22 with clinical details of poly and medical history. No clinical changes since then. Hx colon polyps an Fam Hx CRC. On plavix for Hx CD/stent, on hold last 5 days.  Patient is otherwise without complaints or active issues today.   Past Medical History: Past Medical History:  Diagnosis Date   ABNORMAL GLUCOSE NEC 05/03/2007   ANXIETY 05/03/2007   COLONIC POLYPS, HX OF 07/09/2009   Diabetes mellitus without complication ([place])    HYPERLIPIDEMIA, MIXED 05/03/2007   STEMI (ST elevation myocardial infarction) ([place])    01/16/19 PCI to LAD, RCA   TOBACCO USE 05/03/2007     Past Surgical History: Past Surgical History:  Procedure Laterality Date   COLONOSCOPY     CORONARY STENT INTERVENTION N/A 01/16/2019   Procedure: CORONARY STENT INTERVENTION;  Surgeon: Jettie Booze, MD;  Location: [facility];  Service: Cardiovascular;  Laterality: N/A;   CORONARY/GRAFT ACUTE MI REVASCULARIZATION N/A 01/16/2019   Procedure: Coronary/Graft Acute MI Revascularization;  Surgeon: Jettie Booze, MD;  Location: [facility];  Service: Cardiovascular;  Laterality: N/A;   Fractured elbow, nose and toe     LEFT HEART CATH AND CORONARY ANGIOGRAPHY N/A 01/16/2019   Procedure: LEFT HEART CATH AND CORONARY ANGIOGRAPHY;  Surgeon: Jettie Booze, MD;  Location: [facility];  Service: Cardiovascular;  Laterality: N/A;   WISDOM TOOTH EXTRACTION      Allergies: Allergies  Allergen Reactions   Chantix [Varenicline] Other (See Comments)   Glimepiride Other (See Comments)   Latex     REACTION: itching   Hydralazine Rash    Outpatient Meds: Current Outpatient Medications  Medication Sig Dispense Refill   atorvastatin (LIPITOR) 80 MG tablet TAKE 1 TABLET BY MOUTH EVERY DAY 90 tablet 2    escitalopram (LEXAPRO) 10 MG tablet Take 10 mg by mouth daily.     metFORMIN (GLUCOPHAGE-XR) 500 MG 24 hr tablet 1 tablet in the morning with a meal and 2 tablets with evening meal     metoprolol tartrate (LOPRESSOR) 25 MG tablet TAKE 1/2 (ONE-HALF) TABLET BY MOUTH AT BEDTIME 45 tablet 3   pioglitazone (ACTOS) 30 MG tablet 1 tablet     clopidogrel (PLAVIX) 75 MG tablet Take 1 tablet by mouth once daily 90 tablet 3   diphenhydrAMINE (BENADRYL ALLERGY) 25 MG tablet Take 25 mg ( 1 tablet) or 12.5 mg ( 1/2 tablet) every 4 to 6 hours as needed for rash 30 tablet 0   nitroGLYCERIN (NITROSTAT) 0.4 MG SL tablet Place 1 tablet (0.4 mg total) under the tongue every 5 (five) minutes x 3 doses as needed for chest pain. (Patient not taking: Reported on 04/09/2022) 25 tablet 2   Current Facility-Administered Medications  Medication Dose Route Frequency Provider Last Rate Last Admin   0.9 %  sodium chloride infusion  500 mL Intravenous Once Doran Stabler, MD          ___________________________________________________________________ Objective   Exam:  BP 123/74   Pulse 67   Temp (!) 97.1 F (36.2 C)   Ht '5\' 11"'$  (1.803 m)   Wt 166 lb (75.3 kg)   SpO2 97%   BMI 23.15 kg/m   CV: RRR without murmur, S1/S2 Resp: clear to auscultation bilaterally, normal RR and effort noted GI: soft, no tenderness, with active bowel  sounds.   Assessment: Encounter Diagnosis  Name Primary?   Personal history of colonic polyps Yes     Plan: Colonoscopy  The benefits and risks of the planned procedure were described in detail with the patient or (when appropriate) their health care proxy.  Risks were outlined as including, but not limited to, bleeding, infection, perforation, adverse medication reaction leading to cardiac or pulmonary decompensation, pancreatitis (if ERCP).  The limitation of incomplete mucosal visualization was also discussed.  No guarantees or warranties were given.    The patient is  appropriate for an endoscopic procedure in the ambulatory setting.   - Wilfrid Lund, MD

## 2022-04-09 NOTE — Progress Notes (Signed)
PT taken to PACU. Monitors in place. VSS. Report given to RN. 

## 2022-04-10 ENCOUNTER — Telehealth: Payer: Self-pay | Admitting: *Deleted

## 2022-04-10 NOTE — Telephone Encounter (Signed)
  Follow up Call-     04/09/2022   10:29 AM  Call back number  Post procedure Call Back phone  # [phone]  Permission to leave phone message Yes     Patient questions:  Do you have a fever, pain , or abdominal swelling? No. Pain Score  0 *  Have you tolerated food without any problems? Yes.    Have you been able to return to your normal activities? Yes.    Do you have any questions about your discharge instructions: Diet   No. Medications  No. Follow up visit  No.  Do you have questions or concerns about your Care? No.  Actions: * If pain score is 4 or above: No action needed, pain <4.

## 2022-04-15 ENCOUNTER — Encounter: Payer: Self-pay | Admitting: Gastroenterology

## 2022-04-27 ENCOUNTER — Ambulatory Visit
Admission: RE | Admit: 2022-04-27 | Discharge: 2022-04-27 | Disposition: A | Discharge status: Home / Self Care | Payer: Medicare Other | Source: Ambulatory Visit

## 2022-04-27 DIAGNOSIS — R911 Solitary pulmonary nodule: Secondary | ICD-10-CM

## 2022-04-27 DIAGNOSIS — F17200 Nicotine dependence, unspecified, uncomplicated: Secondary | ICD-10-CM

## 2022-06-15 NOTE — Progress Notes (Unsigned)
Cardiology Office Note   Date:  06/16/2022   ID:  Adam, Farmer 06/28/1951, MRN [id number]  PCP:  Janie Morning, DO    No chief complaint on file.  CAD/Old MI  Wt Readings from Last 3 Encounters:  06/16/22 170 lb 9.6 oz (77.4 kg)  04/09/22 166 lb (75.3 kg)  01/27/22 166 lb (75.3 kg)       History of Present Illness: Adam Farmer is a 71 y.o. male  who had a cath in 01/2019 when he presented with inferior STEMI.  He had PCI of RCA and LAD at that time.   Cath showed: "Prox Cx to Mid Cx lesion is 25% stenosed. Dist LM lesion is 20% stenosed. Ost LAD lesion is 25% stenosed. Mid LAD-1 lesion is 99% stenosed. A drug-eluting stent was successfully placed using a STENT SYNERGY DES 3X16. Post intervention, there is a 0% residual stenosis. Mid LAD-2 lesion is 75% stenosed, edge dissection. A drug-eluting stent was successfully placed using a STENT SYNERGY DES 3X8 for the distal edge dissection, overlapping the 16 mm stent. Post intervention, there is a 0% residual stenosis. Mid RCA-1 lesion is 90% stenosed. A drug-eluting stent was successfully placed using a STENT SYNERGY DES 4X28. Post intervention, there is a 0% residual stenosis. The left ventricular systolic function is normal. LV end diastolic pressure is normal. The left ventricular ejection fraction is 55-65% by visual estimate. There is no aortic valve stenosis.   Continue aggressive secondary prevention.     Start Metoprolol and high dose atorvastatin."   He had a rash after leaving the hospital and meds were changed.   Rash resolved when hydralazine was stopped.    Lisinopril stopped in 2/21 due to low BP.    He still has some dizziness when changing positions.  He has not fallen, but has been close. Metoprolol was changed to 12.5 mg qHS.   He got his COVID vaccines.  Aortic atherosclerosis: No AAA in 9/21.    Watches several grandkids.   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  Some  depression since stopping work.  Lexapro increased.   Denies : Chest pain. Dizziness. Leg edema. Nitroglycerin use. Orthopnea. Palpitations. Paroxysmal nocturnal dyspnea. Shortness of breath. Syncope.      Past Medical History:  Diagnosis Date   ABNORMAL GLUCOSE NEC 05/03/2007   ANXIETY 05/03/2007   COLONIC POLYPS, HX OF 07/09/2009   Diabetes mellitus without complication ([place])    HYPERLIPIDEMIA, MIXED 05/03/2007   STEMI (ST elevation myocardial infarction) ([place])    01/16/19 PCI to LAD, RCA   TOBACCO USE 05/03/2007    Past Surgical History:  Procedure Laterality Date   COLONOSCOPY     CORONARY STENT INTERVENTION N/A 01/16/2019   Procedure: CORONARY STENT INTERVENTION;  Surgeon: Jettie Booze, MD;  Location: [facility];  Service: Cardiovascular;  Laterality: N/A;   CORONARY/GRAFT ACUTE MI REVASCULARIZATION N/A 01/16/2019   Procedure: Coronary/Graft Acute MI Revascularization;  Surgeon: Jettie Booze, MD;  Location: [facility];  Service: Cardiovascular;  Laterality: N/A;   Fractured elbow, nose and toe     LEFT HEART CATH AND CORONARY ANGIOGRAPHY N/A 01/16/2019   Procedure: LEFT HEART CATH AND CORONARY ANGIOGRAPHY;  Surgeon: Jettie Booze, MD;  Location: [facility];  Service: Cardiovascular;  Laterality: N/A;   WISDOM TOOTH EXTRACTION       Current Outpatient Medications  Medication Sig Dispense Refill   atorvastatin (LIPITOR) 80 MG tablet TAKE  1 TABLET BY MOUTH EVERY DAY 90 tablet 2   clopidogrel (PLAVIX) 75 MG tablet Take 1 tablet by mouth once daily 90 tablet 3   diphenhydrAMINE (BENADRYL ALLERGY) 25 MG tablet Take 25 mg ( 1 tablet) or 12.5 mg ( 1/2 tablet) every 4 to 6 hours as needed for rash 30 tablet 0   escitalopram (LEXAPRO) 20 MG tablet Take 20 mg by mouth daily.     metFORMIN (GLUCOPHAGE-XR) 500 MG 24 hr tablet 1 tablet in the morning with a meal and 2 tablets with evening meal     metoprolol tartrate (LOPRESSOR) 25 MG tablet  TAKE 1/2 (ONE-HALF) TABLET BY MOUTH AT BEDTIME 45 tablet 3   nitroGLYCERIN (NITROSTAT) 0.4 MG SL tablet Place 1 tablet (0.4 mg total) under the tongue every 5 (five) minutes x 3 doses as needed for chest pain. 25 tablet 2   pioglitazone (ACTOS) 30 MG tablet 1 tablet     No current facility-administered medications for this visit.    Allergies:   Chantix [varenicline], Glimepiride, Latex, and Hydralazine    Social History:  The patient  reports that he has been smoking cigarettes. He has a 26.00 pack-year smoking history. He has never used smokeless tobacco. He reports current alcohol use of about 1.0 - 2.0 standard drink of alcohol per week. He reports that he does not use drugs.   Family History:  The patient's family history includes Colon cancer in his father; Colon polyps in his father; Diabetes in his father and paternal grandmother; Hyperlipidemia in his mother; Lung cancer in his father.    ROS:  Please see the history of present illness.   Otherwise, review of systems are positive for depression.   All other systems are reviewed and negative.    PHYSICAL EXAM: VS:  BP 110/64   Pulse 80   Ht 5' 11.5" (1.816 m)   Wt 170 lb 9.6 oz (77.4 kg)   SpO2 94%   BMI 23.46 kg/m  , BMI Body mass index is 23.46 kg/m. GEN: Well nourished, well developed, in no acute distress HEENT: normal Neck: no JVD, carotid bruits, or masses Cardiac: RRR; no murmurs, rubs, or gallops,no edema  Respiratory:  clear to auscultation bilaterally, normal work of breathing GI: soft, nontender, nondistended, + BS MS: no deformity or atrophy Skin: warm and dry, macular erythmatous rash on much of his back Neuro:  Strength and sensation are intact Psych: euthymic mood, full affect   EKG:   The ekg ordered today demonstrates normal ECG   Recent Labs: 10/21/2021: ALT 27; BUN 15; Creatinine, Ser 1.08; Hemoglobin 16.5; Platelets 230; Potassium 4.3; Sodium 136   Lipid Panel    Component Value Date/Time    CHOL 100 04/07/2019 1057   TRIG 208 (H) 04/07/2019 1057   HDL 35 (L) 04/07/2019 1057   CHOLHDL 2.9 04/07/2019 1057   CHOLHDL 4.5 01/17/2019 0635   VLDL 36 01/17/2019 0635   LDLCALC 23 04/07/2019 1057   LDLDIRECT 150.5 07/03/2009 1127     Other studies Reviewed: Additional studies/ records that were reviewed today with results demonstrating: labs reviewed.   ASSESSMENT AND PLAN:  CAD/Old MI: s/p LAD and RCA stents. No angina.  COntinue aggressive secondary prevention.  Clopidogrel monotherapy.  Hyperlipidemia: LDL 64.  Continue atorvastatin.  Hypertension: The current medical regimen is effective;  continue present plan and medications. Tobacco abuse: quit a few times but restarted both times Diabetes: A1C 7.0, then down to 6.8.  Aortic atherosclerosis: COntinue statin. Rash:  on his back.  no new meds.  Will try cetaphil.    Current medicines are reviewed at length with the patient today.  The patient concerns regarding his medicines were addressed.  The following changes have been made:  No change  Labs/ tests ordered today include:  No orders of the defined types were placed in this encounter.   Recommend 150 minutes/week of aerobic exercise Low fat, low carb, high fiber diet recommended  Disposition:   FU in 1 year   Signed, Larae Grooms, MD  06/16/2022 4:57 PM    [facility] [address], [place], [place]  [address] Phone: [phone]; Fax: [phone]

## 2022-06-16 ENCOUNTER — Ambulatory Visit: Payer: Medicare Other | Attending: Interventional Cardiology | Admitting: Interventional Cardiology

## 2022-06-16 ENCOUNTER — Encounter: Payer: Self-pay | Admitting: Interventional Cardiology

## 2022-06-16 VITALS — BP 110/64 | HR 80 | Ht 71.5 in | Wt 170.6 lb

## 2022-06-16 DIAGNOSIS — Z720 Tobacco use: Secondary | ICD-10-CM | POA: Diagnosis present

## 2022-06-16 DIAGNOSIS — I2510 Atherosclerotic heart disease of native coronary artery without angina pectoris: Secondary | ICD-10-CM | POA: Diagnosis present

## 2022-06-16 DIAGNOSIS — I700 Atherosclerosis of aorta: Secondary | ICD-10-CM | POA: Insufficient documentation

## 2022-06-16 DIAGNOSIS — I10 Essential (primary) hypertension: Secondary | ICD-10-CM | POA: Insufficient documentation

## 2022-06-16 DIAGNOSIS — E785 Hyperlipidemia, unspecified: Secondary | ICD-10-CM | POA: Insufficient documentation

## 2022-06-16 DIAGNOSIS — E118 Type 2 diabetes mellitus with unspecified complications: Secondary | ICD-10-CM | POA: Insufficient documentation

## 2022-06-16 DIAGNOSIS — I252 Old myocardial infarction: Secondary | ICD-10-CM | POA: Insufficient documentation

## 2022-06-16 DIAGNOSIS — Z9861 Coronary angioplasty status: Secondary | ICD-10-CM | POA: Insufficient documentation

## 2022-06-16 NOTE — Patient Instructions (Signed)
Medication Instructions:  Your physician recommends that you continue on your current medications as directed. Please refer to the Current Medication list given to you today.  *If you need a refill on your cardiac medications before your next appointment, please call your pharmacy*   Lab Work: none If you have labs (blood work) drawn today and your tests are completely normal, you will receive your results only by: MyChart Message (if you have MyChart) OR A paper copy in the mail If you have any lab test that is abnormal or we need to change your treatment, we will call you to review the results.   Testing/Procedures: none   Follow-Up: At [facility], you and your health needs are our priority.  As part of our continuing mission to provide you with exceptional heart care, we have created designated Provider Care Teams.  These Care Teams include your primary Cardiologist (physician) and Advanced Practice Providers (APPs -  Physician Assistants and Nurse Practitioners) who all work together to provide you with the care you need, when you need it.  We recommend signing up for the patient portal called "MyChart".  Sign up information is provided on this After Visit Summary.  MyChart is used to connect with patients for Virtual Visits (Telemedicine).  Patients are able to view lab/test results, encounter notes, upcoming appointments, etc.  Non-urgent messages can be sent to your provider as well.   To learn more about what you can do with MyChart, go to [email or web address].    Your next appointment:   12 month(s)  The format for your next appointment:   In Person  Provider:   Jayadeep Varanasi, MD     Other Instructions    Important Information About Sugar       

## 2022-08-04 ENCOUNTER — Other Ambulatory Visit: Payer: Self-pay | Admitting: Interventional Cardiology

## 2022-08-13 ENCOUNTER — Other Ambulatory Visit: Payer: Self-pay | Admitting: Interventional Cardiology

## 2022-09-09 ENCOUNTER — Ambulatory Visit: Payer: Medicare Other | Admitting: Interventional Cardiology

## 2022-11-20 IMAGING — CT CT CHEST LUNG CANCER SCREENING LOW DOSE W/O CM
1 series · 10 of 10 positions shown, 13 images · non-contrast
Comparison: 07/18/2019

CLINICAL DATA: Lung cancer screening. Current asymptomatic smoker
with 44 pack-year history.

EXAM:
CT CHEST WITHOUT CONTRAST LOW-DOSE FOR LUNG CANCER SCREENING
TECHNIQUE: Multidetector CT imaging of the chest was performed following the
standard protocol without IV contrast.

[ct lung segmentation data · axial · 0.72mm/px · z∈[-350,-350]mm · 10 of 341 frames shown]
[frame 1/341  mediastinal]
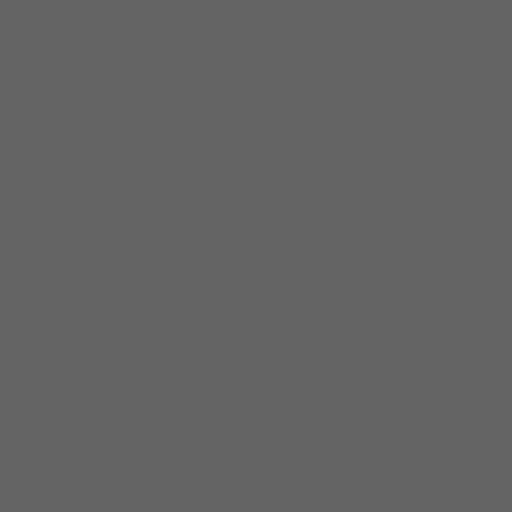
[frame 1/341  lung]
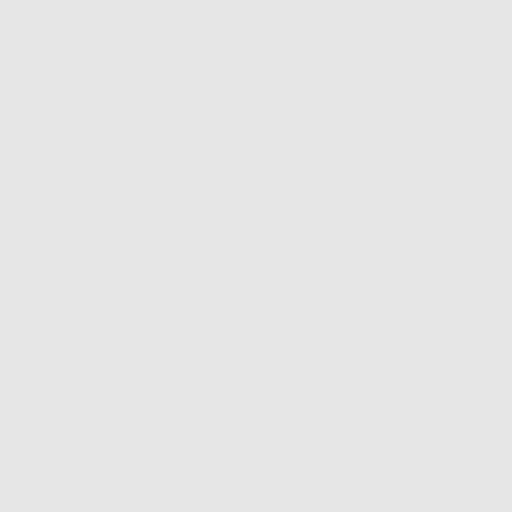
[frame 38/341  lung]
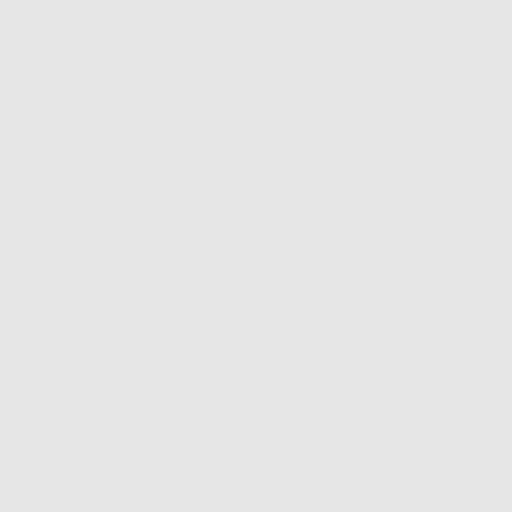
[frame 76/341  lung]
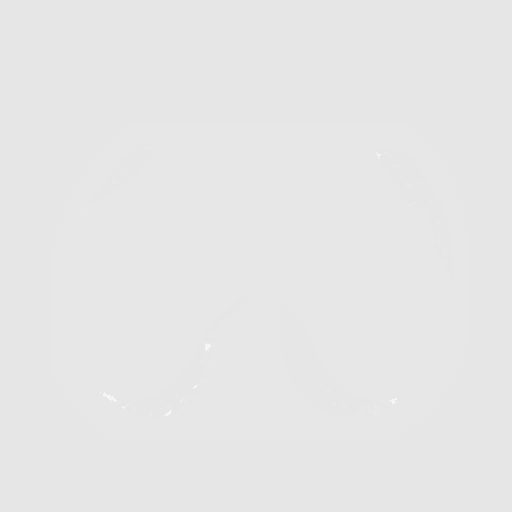
[frame 114/341  lung]
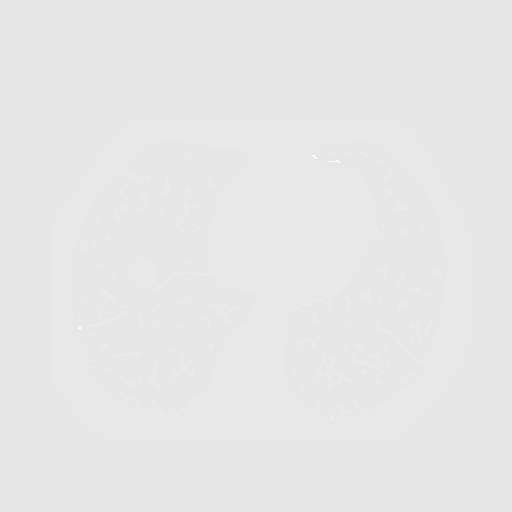
[frame 152/341  mediastinal]
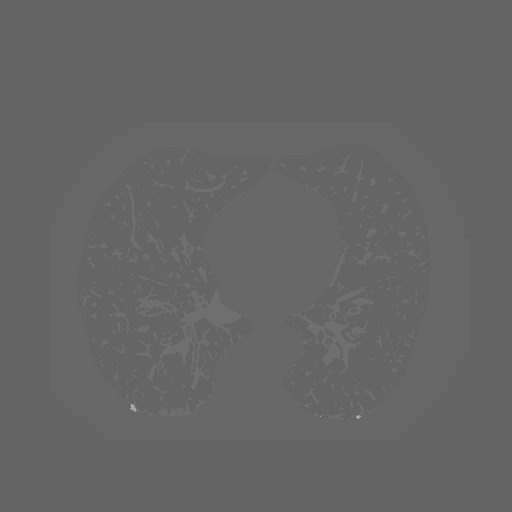
[frame 152/341  lung]
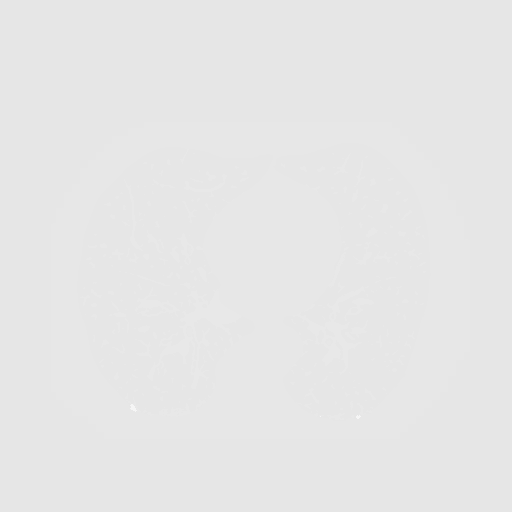
[frame 189/341  lung]
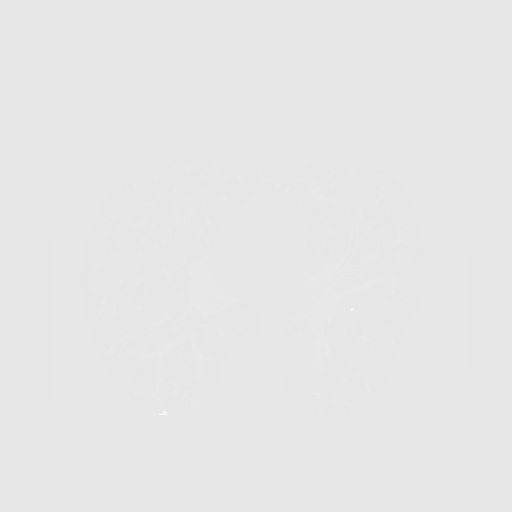
[frame 227/341  lung]
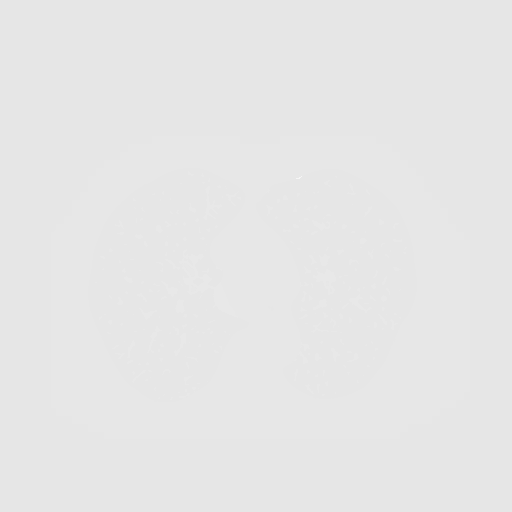
[frame 265/341  lung]
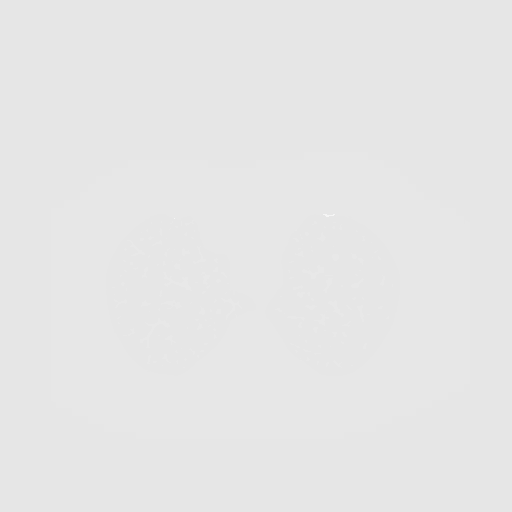
[frame 303/341  mediastinal]
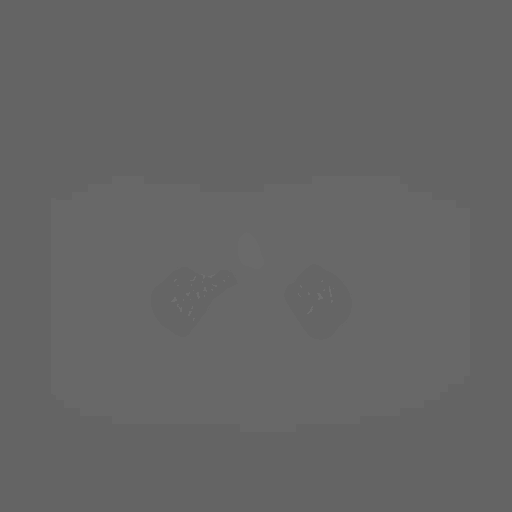
[frame 303/341  lung]
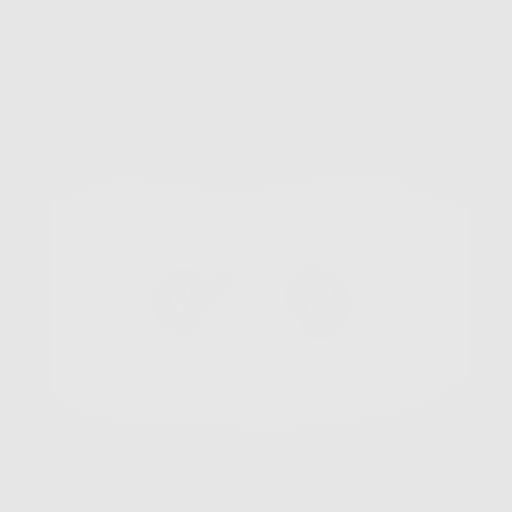
[frame 341/341  lung]
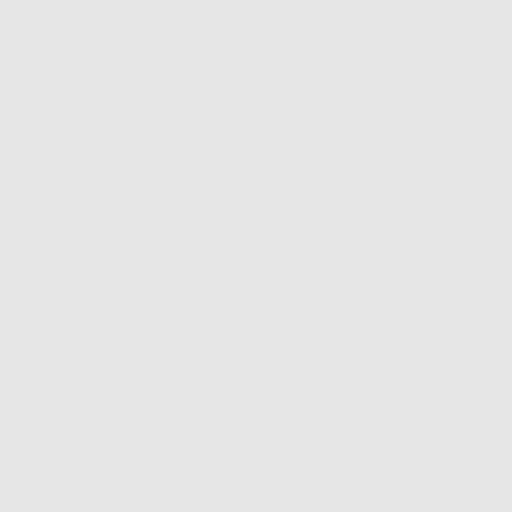

[10 of 10 positions shown; findings below may reference images not displayed]

FINDINGS: Cardiovascular: Heart size appears within normal limits. Aortic
atherosclerosis and coronary artery calcifications. No pericardial
effusion.

Mediastinum/Nodes: No enlarged mediastinal, hilar, or axillary lymph
nodes. Thyroid gland, trachea, and esophagus demonstrate no
significant findings.

Lungs/Pleura: Mild centrilobular and paraseptal emphysema. No
pleural effusion, airspace consolidation, or atelectasis. Two small
nodules are identified within the right lung. The largest measures
3.3 mm and is in the anterior basal right upper lobe. No suspicious
lung nodules identified at this time.

Upper Abdomen: No acute abnormality.

Musculoskeletal: Spondylosis identified within the thoracic spine.
IMPRESSION: 1. Lung-RADS 2, benign appearance or behavior. Continue annual
screening with low-dose chest CT without contrast in 12 months.
2. Coronary artery calcifications.

Aortic Atherosclerosis (K2Q11-ATQ.Q) and Emphysema (K2Q11-DK3.I).

## 2022-12-23 LAB — LAB REPORT - SCANNED
A1c: 7.2
EGFR: 68

## 2023-03-30 ENCOUNTER — Other Ambulatory Visit: Payer: Self-pay

## 2023-03-30 DIAGNOSIS — F17200 Nicotine dependence, unspecified, uncomplicated: Secondary | ICD-10-CM

## 2023-03-30 DIAGNOSIS — R911 Solitary pulmonary nodule: Secondary | ICD-10-CM

## 2023-04-23 ENCOUNTER — Other Ambulatory Visit: Payer: Medicare Other

## 2023-05-07 ENCOUNTER — Ambulatory Visit
Admission: RE | Admit: 2023-05-07 | Discharge: 2023-05-07 | Disposition: A | Discharge status: Home / Self Care | Payer: Medicare Other | Source: Ambulatory Visit

## 2023-05-07 DIAGNOSIS — F17200 Nicotine dependence, unspecified, uncomplicated: Secondary | ICD-10-CM

## 2023-05-07 DIAGNOSIS — R911 Solitary pulmonary nodule: Secondary | ICD-10-CM

## 2023-06-03 NOTE — Progress Notes (Unsigned)
Cardiology Office Note:    Date:  06/14/2023   ID:  Ruthe Mannan, DOB January 19, 1951, MRN [id number]  PCP:  Irena Reichmann, DO   [facility] Providers Cardiologist:  Lance Muss, MD     Referring MD: Irena Reichmann, DO   Chief Complaint  Patient presents with   Coronary Artery Disease    History of Present Illness:    Adam Farmer is a 72 y.o. male seen for follow up CAD. He is a former patient of Dr Eldridge Dace. He presented in June 2020 with inferior STEMI. Had PCI of RCA and LAD at that time. Had rash on hydralazine. Low BP on ACEi. Has HLD, DM, and history of tobacco use. Also history of HTN and aortic atherosclerosis. Has been maintained on Plavix monotherapy.   He states he is doing well from a cardiac standpoint. Denies any chest pain or SOB. Does some yard work and plays a little golf but otherwise not very active. Still smoking 1 ppd. Does feel tired at times and has some difficulty with sleeping.   Past Medical History:  Diagnosis Date   ABNORMAL GLUCOSE NEC 05/03/2007   ANXIETY 05/03/2007   COLONIC POLYPS, HX OF 07/09/2009   Diabetes mellitus without complication (HCC)    HYPERLIPIDEMIA, MIXED 05/03/2007   STEMI (ST elevation myocardial infarction) (HCC)    01/16/19 PCI to LAD, RCA   TOBACCO USE 05/03/2007    Past Surgical History:  Procedure Laterality Date   COLONOSCOPY     CORONARY STENT INTERVENTION N/A 01/16/2019   Procedure: CORONARY STENT INTERVENTION;  Surgeon: Corky Crafts, MD;  Location: [facility] INVASIVE CV LAB;  Service: Cardiovascular;  Laterality: N/A;   CORONARY/GRAFT ACUTE MI REVASCULARIZATION N/A 01/16/2019   Procedure: Coronary/Graft Acute MI Revascularization;  Surgeon: Corky Crafts, MD;  Location: [facility] INVASIVE CV LAB;  Service: Cardiovascular;  Laterality: N/A;   Fractured elbow, nose and toe     LEFT HEART CATH AND CORONARY ANGIOGRAPHY N/A 01/16/2019   Procedure: LEFT HEART CATH AND CORONARY ANGIOGRAPHY;  Surgeon:  Corky Crafts, MD;  Location: [facility] INVASIVE CV LAB;  Service: Cardiovascular;  Laterality: N/A;   WISDOM TOOTH EXTRACTION      Current Medications: No outpatient medications have been marked as taking for the 06/14/23 encounter (Office Visit) with [place], Nickolus Wadding M, MD.     Allergies:   Chantix [varenicline], Glimepiride, Latex, and Hydralazine   Social History   Socioeconomic History   Marital status: Married    Spouse name: Not on file   Number of children: 2   Years of education: 12   Highest education level: Not on file  Occupational History   Occupation: trucking Financial planner  Tobacco Use   Smoking status: Every Day    Current packs/day: 1.00    Average packs/day: 1 pack/day for 26.0 years (26.0 ttl pk-yrs)    Types: Cigarettes   Smokeless tobacco: Never   Tobacco comments:    discussed strategy  Vaping Use   Vaping status: Never Used  Substance and Sexual Activity   Alcohol use: Yes    Alcohol/week: 1.0 - 2.0 standard drink of alcohol    Types: 1 - 2 Standard drinks or equivalent per week   Drug use: No   Sexual activity: Yes    Partners: Female  Other Topics Concern   Not on file  Social History Narrative   Married- '76 (dated since she was 40). Son-'86, graduated [employer]  In [place]; daughter-'81 -  MSW, hospital  Child psychotherapist. Wife-fair health: HTN, torn retina. 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    Social Determinants of Health   Financial Resource Strain: Not on file  Food Insecurity: Not on file  Transportation Needs: Not on file  Physical Activity: Not on file  Stress: Not on file  Social Connections: Not on file     Family History: The patient's family history includes Colon cancer in his father; Colon polyps in his father; Diabetes in his father and paternal grandmother; Hyperlipidemia in his mother; Lung cancer in his father. There is no history of Cancer, Heart disease, Liver cancer, Pancreatic  cancer, Rectal cancer, Stomach cancer, or Esophageal cancer.  ROS:   Please see the history of present illness.     All other systems reviewed and are negative.  EKGs/Labs/Other Studies Reviewed:    The following studies were reviewed today:  EKG Interpretation Date/Time:  Monday June 14 2023 08:25:48 EST Ventricular Rate:  75 PR Interval:  174 QRS Duration:  90 QT Interval:  382 QTC Calculation: 426 R Axis:   55  Text Interpretation: Normal sinus rhythm Cannot rule out Inferior infarct (cited on or before 18-Jan-2019) When compared with ECG of 19-Jan-2019 07:18, ST no longer elevated in Anterior leads Confirmed by [place], Nikko Goldwire [phone]) on 06/14/2023 8:31:08 AM   Cardiac cath 01/16/19:  LEFT HEART CATH AND CORONARY ANGIOGRAPHY   Conclusion    Prox Cx to Mid Cx lesion is 25% stenosed. Dist LM lesion is 20% stenosed. Ost LAD lesion is 25% stenosed. Mid LAD-1 lesion is 99% stenosed. A drug-eluting stent was successfully placed using a STENT SYNERGY DES 3X16. Post intervention, there is a 0% residual stenosis. Mid LAD-2 lesion is 75% stenosed, edge dissection. A drug-eluting stent was successfully placed using a STENT SYNERGY DES 3X8 for the distal edge dissection, overlapping the 16 mm stent. Post intervention, there is a 0% residual stenosis. Mid RCA-1 lesion is 90% stenosed. A drug-eluting stent was successfully placed using a STENT SYNERGY DES 4X28. Post intervention, there is a 0% residual stenosis. The left ventricular systolic function is normal. LV end diastolic pressure is normal. The left ventricular ejection fraction is 55-65% by visual estimate. There is no aortic valve stenosis.   Continue aggressive secondary prevention.     Start Metoprolol and high dose atorvastatin.    Watch in ICU.  Findings discussed with the wife.   Diagnostic Dominance: Right  Intervention    Implants   Echo 11/05/21: IMPRESSIONS     1. Left ventricular ejection fraction by  3D volume is 61 %. The left  ventricle has normal function. The left ventricle has no regional wall  motion abnormalities. Left ventricular diastolic parameters were normal.   2. Right ventricular systolic function is normal. The right ventricular  size is normal. There is normal pulmonary artery systolic pressure.   3. The mitral valve is normal in structure. No evidence of mitral valve  regurgitation. No evidence of mitral stenosis.   4. The aortic valve is normal in structure. Aortic valve regurgitation is  not visualized. No aortic stenosis is present.   5. There is mild dilatation of the aortic root, measuring 38 mm.   6. The inferior vena cava is normal in size with greater than 50%  respiratory variability, suggesting right atrial pressure of 3 mmHg.   Comparison(s): A prior study was performed on 01/17/2019. No significant  change from prior study.   EKG Interpretation Date/Time:  Monday June 14 2023 08:25:48 EST Ventricular Rate:  75 PR Interval:  174 QRS Duration:  90 QT Interval:  382 QTC Calculation: 426 R Axis:   55  Text Interpretation: Normal sinus rhythm Cannot rule out Inferior infarct (cited on or before 18-Jan-2019) When compared with ECG of 19-Jan-2019 07:18, ST no longer elevated in Anterior leads Confirmed by [place], Zabian Swayne [phone]) on 06/14/2023 8:31:08 AM    Recent Labs: No results found for requested labs within last 365 days.  Recent Lipid Panel    Component Value Date/Time   CHOL 100 04/07/2019 1057   TRIG 208 (H) 04/07/2019 1057   HDL 35 (L) 04/07/2019 1057   CHOLHDL 2.9 04/07/2019 1057   CHOLHDL 4.5 01/17/2019 0635   VLDL 36 01/17/2019 0635   LDLCALC 23 04/07/2019 1057   LDLDIRECT 150.5 07/03/2009 1127   Dated 12/22/22: A1c 7.2%, cholesterol 110, triglycerides 94, HDL 42, LDL 50. Normal CBC anc CMET  Risk Assessment/Calculations:                Physical Exam:    VS:  BP 104/70 (BP Location: Left Arm, Patient Position: Sitting, Cuff Size:  Normal)   Pulse 75   Ht 6' (1.829 m)   Wt 170 lb (77.1 kg)   SpO2 91%   BMI 23.06 kg/m     Wt Readings from Last 3 Encounters:  06/14/23 170 lb (77.1 kg)  06/16/22 170 lb 9.6 oz (77.4 kg)  04/09/22 166 lb (75.3 kg)     GEN:  Well nourished, well developed in no acute distress HEENT: Normal NECK: No JVD; No carotid bruits LYMPHATICS: No lymphadenopathy CARDIAC: RRR, no murmurs, rubs, gallops RESPIRATORY:  Clear to auscultation without rales, wheezing or rhonchi  ABDOMEN: Soft, non-tender, non-distended MUSCULOSKELETAL:  No edema; No deformity  SKIN: Warm and dry NEUROLOGIC:  Alert and oriented x 3 PSYCHIATRIC:  Normal affect   ASSESSMENT:    1. Coronary artery disease involving native coronary artery of native heart without angina pectoris   2. Aortic atherosclerosis (HCC)   3. Type 2 diabetes mellitus with complication, without long-term current use of insulin (HCC)   4. Fatigue, unspecified type   5. Essential hypertension   6. Hyperlipidemia LDL goal <70    PLAN:    In order of problems listed above:  CAD/Old MI in 2000: s/p LAD and RCA stents. No angina.  Continue secondary prevention.  Clopidogrel monotherapy. No indication for continued use of metoprolol. Will discontinue. This may be affecting his fatigue and insomnia. Encourage increase in aerobic activity Hyperlipidemia: LDL 50 at goal.  Continue atorvastatin.  Tobacco abuse: quit a few times but restarted both times. Encouraged complete smoking cessation.  Diabetes: on metformin. Per PCP Aortic atherosclerosis: Continue statin.         Follow up in one year  Medication Adjustments/Labs and Tests Ordered: Current medicines are reviewed at length with the patient today.  Concerns regarding medicines are outlined above.  Orders Placed This Encounter  Procedures   EKG 12-Lead   No orders of the defined types were placed in this encounter.   There are no Patient Instructions on file for this visit.    Signed, Veanna Dower [place], MD  06/14/2023 8:36 AM    [facility]

## 2023-06-14 ENCOUNTER — Ambulatory Visit: Payer: Medicare Other | Attending: Cardiology | Admitting: Cardiology

## 2023-06-14 ENCOUNTER — Encounter: Payer: Self-pay | Admitting: Cardiology

## 2023-06-14 VITALS — BP 104/70 | HR 75 | Ht 72.0 in | Wt 170.0 lb

## 2023-06-14 DIAGNOSIS — E118 Type 2 diabetes mellitus with unspecified complications: Secondary | ICD-10-CM | POA: Insufficient documentation

## 2023-06-14 DIAGNOSIS — I700 Atherosclerosis of aorta: Secondary | ICD-10-CM | POA: Insufficient documentation

## 2023-06-14 DIAGNOSIS — R5383 Other fatigue: Secondary | ICD-10-CM | POA: Diagnosis present

## 2023-06-14 DIAGNOSIS — I10 Essential (primary) hypertension: Secondary | ICD-10-CM | POA: Diagnosis present

## 2023-06-14 DIAGNOSIS — I2510 Atherosclerotic heart disease of native coronary artery without angina pectoris: Secondary | ICD-10-CM | POA: Insufficient documentation

## 2023-06-14 DIAGNOSIS — E785 Hyperlipidemia, unspecified: Secondary | ICD-10-CM | POA: Diagnosis present

## 2023-06-14 NOTE — Patient Instructions (Signed)
Medication Instructions:  Stop Metoprolol  *If you need a refill on your cardiac medications before your next appointment, please call your pharmacy*   Lab Work: None needed   Testing/Procedures: None    Follow-Up: At [facility], you and your health needs are our priority.  As part of our continuing mission to provide you with exceptional heart care, we have created designated Provider Care Teams.  These Care Teams include your primary Cardiologist (physician) and Advanced Practice Providers (APPs -  Physician Assistants and Nurse Practitioners) who all work together to provide you with the care you need, when you need it.   Your next appointment:   1 year(s)  Provider:   DR. Peter [place]   Other Instructions Recommended Smoking cessation

## 2023-07-19 ENCOUNTER — Ambulatory Visit
Admission: EM | Admit: 2023-07-19 | Discharge: 2023-07-19 | Disposition: A | Discharge status: Home / Self Care | Payer: Medicare Other | Attending: Family Medicine | Admitting: Family Medicine

## 2023-07-19 ENCOUNTER — Other Ambulatory Visit: Payer: Self-pay | Admitting: Interventional Cardiology

## 2023-07-19 DIAGNOSIS — J0100 Acute maxillary sinusitis, unspecified: Secondary | ICD-10-CM

## 2023-07-19 DIAGNOSIS — J3489 Other specified disorders of nose and nasal sinuses: Secondary | ICD-10-CM

## 2023-07-19 MED ORDER — AMOXICILLIN-POT CLAVULANATE 875-125 MG PO TABS: 1.0000 | ORAL_TABLET | Freq: Two times a day (BID) | ORAL | 0 refills | Status: DC

## 2023-07-19 MED ORDER — PREDNISONE 20 MG PO TABS: ORAL_TABLET | ORAL | 0 refills | Status: DC

## 2023-07-19 NOTE — ED Provider Notes (Signed)
[facility]    CSN: [id number] Arrival date & time: 07/19/23  1533      History   Chief Complaint Chief Complaint  Patient presents with   Cough   Sore Throat   facial pressure     HPI Adam Farmer is a 72 y.o. male.   HPI 72 year old male presents with facial pressure cough, sore throat for 1 week.  Patient reports grandchildren have pneumonia.  PMH significant for T2DM, CAD (S/P STEMI), and tobacco use.  Past Medical History:  Diagnosis Date   ABNORMAL GLUCOSE NEC 05/03/2007   ANXIETY 05/03/2007   COLONIC POLYPS, HX OF 07/09/2009   Diabetes mellitus without complication (HCC)    HYPERLIPIDEMIA, MIXED 05/03/2007   STEMI (ST elevation myocardial infarction) (HCC)    01/16/19 PCI to LAD, RCA   TOBACCO USE 05/03/2007    Patient Active Problem List   Diagnosis Date Noted   Type 2 diabetes mellitus with complication, without long-term current use of insulin (HCC) 01/19/2019   STEMI (ST elevation myocardial infarction) (HCC) 01/16/2019   Acute anterior wall MI (HCC) 01/16/2019   Routine health maintenance 09/05/2011   History of colonic polyps 07/09/2009   HYPERLIPIDEMIA, MIXED 05/03/2007   Anxiety state 05/03/2007   TOBACCO USE 05/03/2007   ABNORMAL GLUCOSE NEC 05/03/2007    Past Surgical History:  Procedure Laterality Date   COLONOSCOPY     CORONARY STENT INTERVENTION N/A 01/16/2019   Procedure: CORONARY STENT INTERVENTION;  Surgeon: Corky Crafts, MD;  Location: [facility] INVASIVE CV LAB;  Service: Cardiovascular;  Laterality: N/A;   CORONARY/GRAFT ACUTE MI REVASCULARIZATION N/A 01/16/2019   Procedure: Coronary/Graft Acute MI Revascularization;  Surgeon: Corky Crafts, MD;  Location: [facility] INVASIVE CV LAB;  Service: Cardiovascular;  Laterality: N/A;   Fractured elbow, nose and toe     LEFT HEART CATH AND CORONARY ANGIOGRAPHY N/A 01/16/2019   Procedure: LEFT HEART CATH AND CORONARY ANGIOGRAPHY;  Surgeon: Corky Crafts, MD;  Location: [facility]  INVASIVE CV LAB;  Service: Cardiovascular;  Laterality: N/A;   WISDOM TOOTH EXTRACTION         Home Medications    Prior to Admission medications   Medication Sig Start Date End Date Taking? Authorizing Provider  amoxicillin-clavulanate (AUGMENTIN) 875-125 MG tablet Take 1 tablet by mouth every 12 (twelve) hours. 07/19/23  Yes Trevor Iha, FNP  predniSONE (DELTASONE) 20 MG tablet Take 3 tabs PO daily x 5 days. 07/19/23  Yes Trevor Iha, FNP  atorvastatin (LIPITOR) 80 MG tablet TAKE 1 TABLET BY MOUTH EVERY DAY 08/01/19   Laverda Page B, NP  clopidogrel (PLAVIX) 75 MG tablet Take 1 tablet by mouth once daily 07/19/23   [place], Peter M, MD  diphenhydrAMINE (BENADRYL ALLERGY) 25 MG tablet Take 25 mg ( 1 tablet) or 12.5 mg ( 1/2 tablet) every 4 to 6 hours as needed for rash 01/26/19   Georgie Chard D, NP  escitalopram (LEXAPRO) 20 MG tablet Take 20 mg by mouth daily.    [provider]  metFORMIN (GLUCOPHAGE-XR) 500 MG 24 hr tablet Take 1,000 mg by mouth 2 (two) times daily with a meal.    [provider]  nitroGLYCERIN (NITROSTAT) 0.4 MG SL tablet Place 1 tablet (0.4 mg total) under the tongue every 5 (five) minutes x 3 doses as needed for chest pain. 01/24/21   Corky Crafts, MD    Family History Family History  Problem Relation Age of Onset   Hyperlipidemia Mother    Colon cancer  Father    Colon polyps Father    Lung cancer Father    Diabetes Father    Diabetes Paternal Grandmother    Cancer Neg Hx    Heart disease Neg Hx    Liver cancer Neg Hx    Pancreatic cancer Neg Hx    Rectal cancer Neg Hx    Stomach cancer Neg Hx    Esophageal cancer Neg Hx     Social History Social History   Tobacco Use   Smoking status: Every Day    Current packs/day: 1.00    Average packs/day: 1 pack/day for 50.0 years (50.0 ttl pk-yrs)    Types: Cigarettes   Smokeless tobacco: Never   Tobacco comments:    discussed strategy  Vaping Use   Vaping status: Never  Used  Substance Use Topics   Alcohol use: Yes    Alcohol/week: 1.0 - 2.0 standard drink of alcohol    Types: 1 - 2 Standard drinks or equivalent per week   Drug use: No     Allergies   Chantix [varenicline], Glimepiride, Latex, and Hydralazine   Review of Systems Review of Systems  HENT:  Positive for congestion, sinus pressure and sinus pain.   All other systems reviewed and are negative.    Physical Exam Triage Vital Signs ED Triage Vitals  Encounter Vitals Group     BP 07/19/23 1630 106/73     Systolic BP Percentile --      Diastolic BP Percentile --      Pulse Rate 07/19/23 1630 79     Resp 07/19/23 1630 18     Temp 07/19/23 1630 98 F (36.7 C)     Temp src --      SpO2 07/19/23 1630 98 %     Weight --      Height --      Head Circumference --      Peak Flow --      Pain Score 07/19/23 1629 2     Pain Loc --      Pain Education --      Exclude from Growth Chart --    No data found.  Updated Vital Signs BP 106/73   Pulse 79   Temp 98 F (36.7 C)   Resp 18   SpO2 98%     Physical Exam Vitals and nursing note reviewed.  Constitutional:      Appearance: Normal appearance. He is well-developed and normal weight. He is ill-appearing.  HENT:     Head: Normocephalic and atraumatic.     Right Ear: Tympanic membrane and external ear normal.     Left Ear: Tympanic membrane and external ear normal.     Ears:     Comments: Significant eustachian tube dysfunction noted bilaterally    Nose:     Right Turbinates: Enlarged.     Left Turbinates: Enlarged.     Right Sinus: Maxillary sinus tenderness and frontal sinus tenderness present.     Left Sinus: Maxillary sinus tenderness and frontal sinus tenderness present.     Comments: Turbinates are erythematous/edematous    Mouth/Throat:     Mouth: Mucous membranes are moist.     Pharynx: Oropharynx is clear.     Comments: Significant amount of clear drainage of posterior oropharynx noted Eyes:     Extraocular  Movements: Extraocular movements intact.     Conjunctiva/sclera: Conjunctivae normal.     Pupils: Pupils are equal, round, and reactive to light.  Cardiovascular:  Rate and Rhythm: Normal rate and regular rhythm.     Pulses: Normal pulses.     Heart sounds: Normal heart sounds.  Pulmonary:     Effort: Pulmonary effort is normal.     Breath sounds: Normal breath sounds. No wheezing, rhonchi or rales.  Musculoskeletal:        General: Normal range of motion.     Cervical back: Normal range of motion and neck supple.  Skin:    General: Skin is warm and dry.  Neurological:     General: No focal deficit present.     Mental Status: He is alert and oriented to person, place, and time. Mental status is at baseline.  Psychiatric:        Mood and Affect: Mood normal.        Behavior: Behavior normal.      UC Treatments / Results  Labs (all labs ordered are listed, but only abnormal results are displayed) Labs Reviewed - No data to display  EKG   Radiology No results found.  Procedures Procedures (including critical care time)  Medications Ordered in UC Medications - No data to display  Initial Impression / Assessment and Plan / UC Course  I have reviewed the triage vital signs and the nursing notes.  Pertinent labs & imaging results that were available during my care of the patient were reviewed by me and considered in my medical decision making (see chart for details).     MDM: 1.  Acute maxillary sinusitis, recurrence not specified-Rx'd Augmentin 875/125 mg tablet: Take 1 tablet twice daily x 7 days; 2.  Sinus pressure-Rx'd prednisone 20 mg tablet: Take 3 tabs p.o. daily x 5 days. Advised patient to take medications as directed with food to completion.  Advised patient to take prednisone with first dose of Augmentin for the next 5 of 7 days.  Encouraged to increase daily water intake to 64 ounces per day while taking these medications.  Advised if symptoms worsen and/or  unresolved please follow-up with PCP or here for further evaluation.  Patient discharged home, hemodynamically stable. Final Clinical Impressions(s) / UC Diagnoses   Final diagnoses:  Acute maxillary sinusitis, recurrence not specified  Sinus pressure     Discharge Instructions      Advised patient to take medications as directed with food to completion.  Advised patient to take prednisone with first dose of Augmentin for the next 5 of 7 days.  Encouraged to increase daily water intake to 64 ounces per day while taking these medications.  Advised if symptoms worsen and/or unresolved please follow-up with PCP or here for further evaluation.     ED Prescriptions     Medication Sig Dispense Auth. Provider   predniSONE (DELTASONE) 20 MG tablet Take 3 tabs PO daily x 5 days. 15 tablet Trevor Iha, FNP   amoxicillin-clavulanate (AUGMENTIN) 875-125 MG tablet Take 1 tablet by mouth every 12 (twelve) hours. 14 tablet Trevor Iha, FNP      PDMP not reviewed this encounter.   Trevor Iha, FNP 07/19/23 1752

## 2023-07-19 NOTE — ED Triage Notes (Signed)
Pt presents to [facility] with co of facial pressure, cough and sore throat since 1 week ago. Pt reports his grandchildren have pneumonia. Pt has been using sudafed and motrin for symptoms.

## 2023-07-19 NOTE — Discharge Instructions (Addendum)
Advised patient to take medications as directed with food to completion.  Advised patient to take prednisone with first dose of Augmentin for the next 5 of 7 days.  Encouraged to increase daily water intake to 64 ounces per day while taking these medications.  Advised if symptoms worsen and/or unresolved please follow-up with PCP or here for further evaluation.

## 2023-07-28 ENCOUNTER — Other Ambulatory Visit: Payer: Self-pay | Admitting: Interventional Cardiology

## 2024-03-20 ENCOUNTER — Telehealth: Payer: Self-pay | Admitting: Interventional Cardiology

## 2024-03-20 MED ORDER — NITROGLYCERIN 0.4 MG SL SUBL: 0.4000 mg | SUBLINGUAL_TABLET | SUBLINGUAL | 2 refills | Status: AC | PRN

## 2024-03-20 NOTE — Telephone Encounter (Addendum)
 Chart reviewed.   Last appointment was: 06/14/2023 with Dr Dann.  Rx(s) sent to pharmacy electronically.  Yearly visit has been scheduled with Angie Duke on 06/21/2024; patient agreeable and voiced understanding.  Patient has been notified and thanked me for calling.

## 2024-03-20 NOTE — Telephone Encounter (Signed)
*  STAT* If patient is at the pharmacy, call can be transferred to refill team.   1. Which medications need to be refilled? (please list name of each medication and dose if known) nitroGLYCERIN (NITROSTAT) 0.4 MG SL tablet  2. Which pharmacy/location (including street and city if local pharmacy) is medication to be sent to? [place], [place]  3. Do they need a 30 day or 90 day supply? [place]

## 2024-06-19 NOTE — Progress Notes (Unsigned)
 Cardiology Office Note:    Date:  06/21/2024   ID:  Adam Farmer, DOB 1950/12/12, MRN [id number]  PCP:  [facility]   [facility] Providers Cardiologist:  Peter Jordan, MD     Referring MD: [facility] *   Chief Complaint  Patient presents with   Follow-up    CAD    History of Present Illness:    Adam Farmer is a 73 y.o. male with a hx of inferior STEMI 01/2019 treated with PCI/DES-RCA and LAD. He has a hx of HLD, DM, HTN, aortic atherosclerosis, and hx of tobacco abuse.   DES-LAD 3 x 16 mm --> distal edge dissection treated with 3 x 8 mm overlapping stent. DES-RCA 4 x 28 mm.   Did not tolerate hydralazine  due to rash, hypotension on ACEI. He is on plavix  monotherapy. BB D/C'ed for fatigue, normal LVEF.   Previously followed by Dr. Dann, now with Dr. Jordan.   He presents for his routine annual cardiology visit. He has no cardiac complaints. He reports little activity, but does push mow yard for 45 minutes and rakes leaves. He continues to smokes 1ppd, is interested in smoking cessation.    Past Medical History:  Diagnosis Date   ABNORMAL GLUCOSE NEC 05/03/2007   ANXIETY 05/03/2007   COLONIC POLYPS, HX OF 07/09/2009   Diabetes mellitus without complication (HCC)    HYPERLIPIDEMIA, MIXED 05/03/2007   STEMI (ST elevation myocardial infarction) (HCC)    01/16/19 PCI to LAD, RCA   TOBACCO USE 05/03/2007    Past Surgical History:  Procedure Laterality Date   COLONOSCOPY     CORONARY STENT INTERVENTION N/A 01/16/2019   Procedure: CORONARY STENT INTERVENTION;  Surgeon: Dann Candyce RAMAN, MD;  Location: [facility] INVASIVE CV LAB;  Service: Cardiovascular;  Laterality: N/A;   CORONARY/GRAFT ACUTE MI REVASCULARIZATION N/A 01/16/2019   Procedure: Coronary/Graft Acute MI Revascularization;  Surgeon: Dann Candyce RAMAN, MD;  Location: [facility] INVASIVE CV LAB;  Service: Cardiovascular;  Laterality: N/A;   Fractured elbow, nose and toe      LEFT HEART CATH AND CORONARY ANGIOGRAPHY N/A 01/16/2019   Procedure: LEFT HEART CATH AND CORONARY ANGIOGRAPHY;  Surgeon: Dann Candyce RAMAN, MD;  Location: [facility] INVASIVE CV LAB;  Service: Cardiovascular;  Laterality: N/A;   WISDOM TOOTH EXTRACTION      Current Medications: Current Meds  Medication Sig   escitalopram  (LEXAPRO ) 20 MG tablet Take 20 mg by mouth daily.   metFORMIN (GLUCOPHAGE-XR) 500 MG 24 hr tablet Take 1,000 mg by mouth 2 (two) times daily with a meal.   nicotine  (NICODERM CQ  - DOSED IN MG/24 HOURS) 14 mg/24hr patch Place 1 patch (14 mg total) onto the skin daily.   nitroGLYCERIN  (NITROSTAT ) 0.4 MG SL tablet Place 1 tablet (0.4 mg total) under the tongue every 5 (five) minutes x 3 doses as needed for chest pain.   [DISCONTINUED] atorvastatin  (LIPITOR ) 80 MG tablet TAKE 1 TABLET BY MOUTH EVERY DAY   [DISCONTINUED] clopidogrel  (PLAVIX ) 75 MG tablet Take 1 tablet by mouth once daily     Allergies:   Chantix [varenicline], Glimepiride, Latex, and Hydralazine    Social History   Socioeconomic History   Marital status: Married    Spouse name: Not on file   Number of children: 2   Years of education: 12   Highest education level: Not on file  Occupational History   Occupation: trucking financial planner  Tobacco Use   Smoking status: Every Day    Current packs/day:  1.00    Average packs/day: 1 pack/day for 50.0 years (50.0 ttl pk-yrs)    Types: Cigarettes   Smokeless tobacco: Never   Tobacco comments:    discussed strategy  Vaping Use   Vaping status: Never Used  Substance and Sexual Activity   Alcohol use: Yes    Alcohol/week: 1.0 - 2.0 standard drink of alcohol    Types: 1 - 2 Standard drinks or equivalent per week   Drug use: No   Sexual activity: Yes    Partners: Female  Other Topics Concern   Not on file  Social History Narrative   Married- '76 (dated since she was 55). Son-'86, graduated [employer]  In [place] ; daughter-'81 -  MSW, hospital child psychotherapist.  Wife-[employer]: HTN, torn retina. 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    Social Drivers of Corporate Investment Banker Strain: Not on file  Food Insecurity: Not on file  Transportation Needs: Not on file  Physical Activity: Not on file  Stress: Not on file  Social Connections: Not on file     Family History: The patient's family history includes Colon cancer in his father; Colon polyps in his father; Diabetes in his father and paternal grandmother; Hyperlipidemia in his mother; Lung cancer in his father. There is no history of Cancer, Heart disease, Liver cancer, Pancreatic cancer, Rectal cancer, Stomach cancer, or Esophageal cancer.  ROS:   Please see the history of present illness.     All other systems reviewed and are negative.  EKGs/Labs/Other Studies Reviewed:    The following studies were reviewed today:  EKG Interpretation Date/Time:  Wednesday June 21 2024 09:23:48 EST Ventricular Rate:  87 PR Interval:  180 QRS Duration:  86 QT Interval:  352 QTC Calculation: 423 R Axis:   64  Text Interpretation: Normal sinus rhythm Cannot rule out Anterior infarct , age undetermined When compared with ECG of 14-Jun-2023 08:25, Nonspecific T wave abnormality now evident in Lateral leads Confirmed by Madie Slough ([id number]) on 06/21/2024 9:39:02 AM    Recent Labs: No results found for requested labs within last 365 days.  Recent Lipid Panel    Component Value Date/Time   CHOL 100 04/07/2019 1057   TRIG 208 (H) 04/07/2019 1057   HDL 35 (L) 04/07/2019 1057   CHOLHDL 2.9 04/07/2019 1057   CHOLHDL 4.5 01/17/2019 0635   VLDL 36 01/17/2019 0635   LDLCALC 23 04/07/2019 1057   LDLDIRECT 150.5 07/03/2009 1127     Risk Assessment/Calculations:                Physical Exam:    VS:  BP 100/72   Pulse 87   Ht 5' 11 (1.803 m)   Wt 160 lb 12.8 oz (72.9 kg)   SpO2 92%   BMI 22.43 kg/m     Wt Readings from Last 3  Encounters:  06/21/24 160 lb 12.8 oz (72.9 kg)  06/14/23 170 lb (77.1 kg)  06/16/22 170 lb 9.6 oz (77.4 kg)     GEN:  Well nourished, well developed in no acute distress HEENT: Normal NECK: No JVD; No carotid bruits LYMPHATICS: No lymphadenopathy CARDIAC: RRR, no murmurs, rubs, gallops RESPIRATORY:  Clear to auscultation without rales, wheezing or rhonchi  ABDOMEN: Soft, non-tender, non-distended MUSCULOSKELETAL:  No edema; No deformity  SKIN: Warm and dry NEUROLOGIC:  Alert and oriented x 3 PSYCHIATRIC:  Normal affect   ASSESSMENT:    1. Coronary artery disease involving  native coronary artery of native heart without angina pectoris   2. Aortic atherosclerosis   3. Hyperlipidemia with target LDL less than 70   4. TOBACCO USE    PLAN:    In order of problems listed above:  CAD - s/p STEMI  2020 - DES x 2 - LAD - DES - RCA - plavix  monotherapy, no bleeding - can complete more than 4.0 METS without angina   Hypertension - on no current medications, BP controlled   Hyperlipidemia with LDL Goal < 55 Aortic atherosclerosis - lower goal with prior tobacco use and DM - lipid panel 12/2023 with LDL 54 - continue 80 mg lipitor    Tobacco abuse - encouraged cessation - still smoking 1ppd --> he is willing to try nicotine  patch, will prescribe 14 mg patch - refer to SW for smoking cessation - recommend 1-800-QUIT-NOW   Follow up in 1 year.            Medication Adjustments/Labs and Tests Ordered: Current medicines are reviewed at length with the patient today.  Concerns regarding medicines are outlined above.  Orders Placed This Encounter  Procedures   EKG 12-Lead   Meds ordered this encounter  Medications   nicotine  (NICODERM CQ  - DOSED IN MG/24 HOURS) 14 mg/24hr patch    Sig: Place 1 patch (14 mg total) onto the skin daily.    Dispense:  84 patch    Refill:  3   atorvastatin  (LIPITOR ) 80 MG tablet    Sig: Take 1 tablet (80 mg total) by mouth daily.     Dispense:  90 tablet    Refill:  3   clopidogrel  (PLAVIX ) 75 MG tablet    Sig: Take 1 tablet (75 mg total) by mouth daily.    Dispense:  90 tablet    Refill:  3    Patient Instructions  Medication Instructions:  Start Nicoderm CQ  apply 1 patch daily *If you need a refill on your cardiac medications before your next appointment, please call your pharmacy*   Follow-Up: At [facility], you and your health needs are our priority.  As part of our continuing mission to provide you with exceptional heart care, our providers are all part of one team.  This team includes your primary Cardiologist (physician) and Advanced Practice Providers or APPs (Physician Assistants and Nurse Practitioners) who all work together to provide you with the care you need, when you need it.  Your next appointment:   1 year(s)  Provider:   Peter Jordan, MD              Signed, Jon Garre Amilcar Reever, [place]  06/21/2024 9:58 AM    [facility]

## 2024-06-21 ENCOUNTER — Encounter: Payer: Self-pay | Admitting: Physician Assistant

## 2024-06-21 ENCOUNTER — Ambulatory Visit: Attending: Student in an Organized Health Care Education/Training Program | Admitting: Physician Assistant

## 2024-06-21 VITALS — BP 100/72 | HR 87 | Ht 71.0 in | Wt 160.8 lb

## 2024-06-21 DIAGNOSIS — F17200 Nicotine dependence, unspecified, uncomplicated: Secondary | ICD-10-CM | POA: Insufficient documentation

## 2024-06-21 DIAGNOSIS — I2510 Atherosclerotic heart disease of native coronary artery without angina pectoris: Secondary | ICD-10-CM | POA: Insufficient documentation

## 2024-06-21 DIAGNOSIS — I700 Atherosclerosis of aorta: Secondary | ICD-10-CM | POA: Insufficient documentation

## 2024-06-21 DIAGNOSIS — E785 Hyperlipidemia, unspecified: Secondary | ICD-10-CM | POA: Diagnosis present

## 2024-06-21 MED ORDER — NICOTINE 14 MG/24HR TD PT24: 14.0000 mg | MEDICATED_PATCH | Freq: Every day | TRANSDERMAL | 3 refills | Status: AC

## 2024-06-21 MED ORDER — CLOPIDOGREL BISULFATE 75 MG PO TABS: 75.0000 mg | ORAL_TABLET | Freq: Every day | ORAL | 3 refills | Status: AC

## 2024-06-21 MED ORDER — ATORVASTATIN CALCIUM 80 MG PO TABS: 80.0000 mg | ORAL_TABLET | Freq: Every day | ORAL | 3 refills | Status: AC

## 2024-06-21 NOTE — Patient Instructions (Signed)
 Medication Instructions:  Start Nicoderm CQ  apply 1 patch daily *If you need a refill on your cardiac medications before your next appointment, please call your pharmacy*   Follow-Up: At [facility], you and your health needs are our priority.  As part of our continuing mission to provide you with exceptional heart care, our providers are all part of one team.  This team includes your primary Cardiologist (physician) and Advanced Practice Providers or APPs (Physician Assistants and Nurse Practitioners) who all work together to provide you with the care you need, when you need it.  Your next appointment:   1 year(s)  Provider:   Peter Jordan, MD

## 2024-08-09 ENCOUNTER — Other Ambulatory Visit: Payer: Self-pay

## 2024-08-09 DIAGNOSIS — R911 Solitary pulmonary nodule: Secondary | ICD-10-CM

## 2024-08-09 DIAGNOSIS — F17200 Nicotine dependence, unspecified, uncomplicated: Secondary | ICD-10-CM

## 2024-08-21 ENCOUNTER — Inpatient Hospital Stay: Admission: RE | Admit: 2024-08-21 | Discharge: 2024-08-21 | Disposition: A | Source: Ambulatory Visit

## 2024-08-21 DIAGNOSIS — R911 Solitary pulmonary nodule: Secondary | ICD-10-CM

## 2024-08-21 DIAGNOSIS — F17200 Nicotine dependence, unspecified, uncomplicated: Secondary | ICD-10-CM
